# Patient Record
Sex: Female | Born: 1935 | Race: White | Hispanic: No | Smoking: Former smoker
Health system: Southern US, Community
[De-identification: ages and names within clinical notes are randomized; demographics above are authoritative.]

## PROBLEM LIST (undated history)

## (undated) DIAGNOSIS — C50919 Malignant neoplasm of unspecified site of unspecified female breast: Secondary | ICD-10-CM

## (undated) DIAGNOSIS — I509 Heart failure, unspecified: Secondary | ICD-10-CM

## (undated) DIAGNOSIS — I890 Lymphedema, not elsewhere classified: Secondary | ICD-10-CM

## (undated) DIAGNOSIS — I7100 Dissection of unspecified site of aorta: Secondary | ICD-10-CM

## (undated) DIAGNOSIS — N179 Acute kidney failure, unspecified: Secondary | ICD-10-CM

## (undated) DIAGNOSIS — I5031 Acute diastolic (congestive) heart failure: Secondary | ICD-10-CM

## (undated) DIAGNOSIS — I10 Essential (primary) hypertension: Secondary | ICD-10-CM

## (undated) DIAGNOSIS — I4891 Unspecified atrial fibrillation: Secondary | ICD-10-CM

## (undated) DIAGNOSIS — E079 Disorder of thyroid, unspecified: Secondary | ICD-10-CM

## (undated) DIAGNOSIS — N390 Urinary tract infection, site not specified: Secondary | ICD-10-CM

## (undated) DIAGNOSIS — I719 Aortic aneurysm of unspecified site, without rupture: Secondary | ICD-10-CM

## (undated) HISTORY — PX: ASCENDING AORTIC ANEURYSM REPAIR: SHX1191

## (undated) HISTORY — PX: BREAST LUMPECTOMY: SHX2

## (undated) HISTORY — PX: DESCENDING AORTIC ANEURYSM REPAIR: SHX1455

## (undated) HISTORY — PX: ABDOMINAL HYSTERECTOMY: SHX81

---

## 2003-12-13 ENCOUNTER — Encounter: Admission: RE | Admit: 2003-12-13 | Discharge: 2003-12-13 | Payer: Self-pay | Admitting: Sports Medicine

## 2006-12-20 ENCOUNTER — Emergency Department (HOSPITAL_COMMUNITY): Admission: EM | Admit: 2006-12-20 | Discharge: 2006-12-21 | Payer: Self-pay | Admitting: Emergency Medicine

## 2009-09-22 ENCOUNTER — Emergency Department (HOSPITAL_COMMUNITY): Admission: EM | Admit: 2009-09-22 | Discharge: 2009-09-22 | Payer: Self-pay | Admitting: Emergency Medicine

## 2013-04-29 ENCOUNTER — Encounter (HOSPITAL_BASED_OUTPATIENT_CLINIC_OR_DEPARTMENT_OTHER): Payer: Self-pay | Admitting: Emergency Medicine

## 2013-04-29 ENCOUNTER — Emergency Department (HOSPITAL_BASED_OUTPATIENT_CLINIC_OR_DEPARTMENT_OTHER)
Admission: EM | Admit: 2013-04-29 | Discharge: 2013-04-30 | Disposition: A | Discharge status: Other Acute Inpatient Hospital | Payer: Medicare Other | Attending: Emergency Medicine | Admitting: Emergency Medicine

## 2013-04-29 DIAGNOSIS — Z87891 Personal history of nicotine dependence: Secondary | ICD-10-CM | POA: Insufficient documentation

## 2013-04-29 DIAGNOSIS — I509 Heart failure, unspecified: Secondary | ICD-10-CM | POA: Insufficient documentation

## 2013-04-29 DIAGNOSIS — Z9889 Other specified postprocedural states: Secondary | ICD-10-CM | POA: Insufficient documentation

## 2013-04-29 DIAGNOSIS — N179 Acute kidney failure, unspecified: Secondary | ICD-10-CM | POA: Insufficient documentation

## 2013-04-29 DIAGNOSIS — I4891 Unspecified atrial fibrillation: Secondary | ICD-10-CM | POA: Insufficient documentation

## 2013-04-29 DIAGNOSIS — Z90710 Acquired absence of both cervix and uterus: Secondary | ICD-10-CM | POA: Insufficient documentation

## 2013-04-29 DIAGNOSIS — Z853 Personal history of malignant neoplasm of breast: Secondary | ICD-10-CM | POA: Insufficient documentation

## 2013-04-29 DIAGNOSIS — K922 Gastrointestinal hemorrhage, unspecified: Secondary | ICD-10-CM | POA: Insufficient documentation

## 2013-04-29 DIAGNOSIS — Z79899 Other long term (current) drug therapy: Secondary | ICD-10-CM | POA: Insufficient documentation

## 2013-04-29 DIAGNOSIS — I10 Essential (primary) hypertension: Secondary | ICD-10-CM | POA: Insufficient documentation

## 2013-04-29 DIAGNOSIS — E079 Disorder of thyroid, unspecified: Secondary | ICD-10-CM | POA: Insufficient documentation

## 2013-04-29 HISTORY — DX: Essential (primary) hypertension: I10

## 2013-04-29 HISTORY — DX: Unspecified atrial fibrillation: I48.91

## 2013-04-29 HISTORY — DX: Dissection of unspecified site of aorta: I71.00

## 2013-04-29 HISTORY — DX: Aortic aneurysm of unspecified site, without rupture: I71.9

## 2013-04-29 HISTORY — DX: Malignant neoplasm of unspecified site of unspecified female breast: C50.919

## 2013-04-29 HISTORY — DX: Disorder of thyroid, unspecified: E07.9

## 2013-04-29 HISTORY — DX: Heart failure, unspecified: I50.9

## 2013-04-29 HISTORY — DX: Acute kidney failure, unspecified: N17.9

## 2013-04-29 MED ORDER — SODIUM CHLORIDE 0.9 % IV SOLN
INTRAVENOUS | Status: DC
  Administered 2013-04-30: 1000 mL via INTRAVENOUS

## 2013-04-29 MED ORDER — SODIUM CHLORIDE 0.9 % IV SOLN: INTRAVENOUS | Status: DC

## 2013-04-29 NOTE — ED Notes (Signed)
Pt recently discharged from hospital with ARF and CHF. Pt is on xarelto.

## 2013-04-29 NOTE — ED Notes (Signed)
Pt reports rectal bleeding since this am with increase in frequency this evening.

## 2013-04-29 NOTE — ED Provider Notes (Addendum)
CSN: [id number]     Arrival date & time 04/29/13  2315 History  This chart was scribed for Hanley Seamen, MD by Ronal Fear, ED Scribe. This patient was seen in room MH11/MH11 and the patient's care was started at 11:41 PM.    Chief Complaint  Patient presents with  . Rectal Bleeding    The history is provided by the patient. No language interpreter was used.    HPI Comments: Beth [name] is a 77 y.o. female who presents to the Emergency Department complaining of gradually worsening rectal bleeding onset this morning. She has passed both blood and blood mixed with either stool or clots; the blood is brownish red. Pt states that there is no pain with passing stools. She denies nausea, vomiting, light headedness, CP, and SOB. She does not appear to be in any acute distress with no other complaints. There are no specific mitigating or exacerbating factors. PCP: Dr. Marlou Starks  Past Medical History  Diagnosis Date  . Atrial fibrillation   . Hypertension   . Thyroid disease   . Aortic aneurysm with dissection   . Breast CA   . Acute renal failure (ARF)   . CHF (congestive heart failure)   . Aortic aneurysm, descending    Past Surgical History  Procedure Laterality Date  . Ascending aortic aneurysm repair    . Descending aortic aneurysm repair    . Breast lumpectomy    . Abdominal hysterectomy     No family history on file. History  Substance Use Topics  . Smoking status: Former Games developer  . Smokeless tobacco: Not on file  . Alcohol Use: No   OB History   Grav Para Term Preterm Abortions TAB SAB Ect Mult Living                 Review of Systems 10 Systems reviewed and are negative for acute change except as noted in the HPI.  Allergies  Review of patient's allergies indicates no known allergies.  Home Medications   Current Outpatient Rx  Name  Route  Sig  Dispense  Refill  . flecainide (TAMBOCOR) 50 MG tablet   Oral   Take 50 mg by mouth 2 (two) times daily.         . furosemide (LASIX) 20 MG tablet   Oral   Take 20 mg by mouth.         . levothyroxine (SYNTHROID, LEVOTHROID) 75 MCG tablet   Oral   Take 75 mcg by mouth daily before breakfast.         . losartan (COZAAR) 100 MG tablet   Oral   Take 100 mg by mouth daily.         . metoprolol tartrate (LOPRESSOR) 25 MG tablet   Oral   Take 25 mg by mouth 2 (two) times daily.         . pantoprazole (PROTONIX) 40 MG tablet   Oral   Take 40 mg by mouth daily.         . Rivaroxaban (XARELTO PO)   Oral   Take 20 mg by mouth daily.          BP 159/51  Pulse 62  Temp(Src) 97.3 F (36.3 C) (Oral)  Resp 18  Ht 5\' 5"  (1.651 m)  Wt 151 lb (68.493 kg)  BMI 25.13 kg/m2  SpO2 100%  Physical Exam  Nursing note and vitals reviewed.  General: Well-developed, well-nourished female in no acute distress; appearance  consistent with age of record HENT: normocephalic; atraumatic Eyes: pupils equal, round and reactive to light; extraocular muscles intact; no conjunctival pallor  Neck: supple Heart: regular rate and rhythm; no murmurs, rubs or gallops Lungs: clear to auscultation bilaterally Abdomen: soft; nondistended; nontender; no masses or hepatosplenomegaly; bowel sounds present Rectal: normal tone; gross blood in rectal vault Extremities: No deformity; full range of motion; pulses normal; no edema Neurologic: Awake, alert and oriented; motor function intact in all extremities and symmetric; no facial droop; hard of hearing Skin: Warm and dry Psychiatric: Normal mood and affect   ED Course  Procedures (including critical care time) DIAGNOSTIC STUDIES: Oxygen Saturation is 100% on RA, normal by my interpretation.    COORDINATION OF CARE:  11:49 PM- Pt advised of plan for treatment starting th ept of IV fluids and pt agrees.   MDM   Nursing notes and vitals signs, including pulse oximetry, reviewed.  Summary of this visit's results, reviewed by myself:  Labs:  Results  for orders placed during the hospital encounter of 04/29/13 (from the past 24 hour(s))  CBC WITH DIFFERENTIAL     Status: Abnormal   Collection Time    04/29/13 11:57 PM      Result Value Range   WBC 4.7  4.0 - 10.5 K/uL   RBC 3.08 (*) 3.87 - 5.11 MIL/uL   Hemoglobin 9.8 (*) 12.0 - 15.0 g/dL   HCT 47.8 (*) 29.5 - 62.1 %   MCV 98.1  78.0 - 100.0 fL   MCH 31.8  26.0 - 34.0 pg   MCHC 32.5  30.0 - 36.0 g/dL   RDW 30.8  65.7 - 84.6 %   Platelets 203  150 - 400 K/uL   Neutrophils Relative % 65  43 - 77 %   Neutro Abs 3.1  1.7 - 7.7 K/uL   Lymphocytes Relative 16  12 - 46 %   Lymphs Abs 0.8  0.7 - 4.0 K/uL   Monocytes Relative 14 (*) 3 - 12 %   Monocytes Absolute 0.7  0.1 - 1.0 K/uL   Eosinophils Relative 4  0 - 5 %   Eosinophils Absolute 0.2  0.0 - 0.7 K/uL   Basophils Relative 0  0 - 1 %   Basophils Absolute 0.0  0.0 - 0.1 K/uL  COMPREHENSIVE METABOLIC PANEL     Status: Abnormal   Collection Time    04/29/13 11:57 PM      Result Value Range   Sodium 133 (*) 135 - 145 mEq/L   Potassium 4.8  3.5 - 5.1 mEq/L   Chloride 97  96 - 112 mEq/L   CO2 26  19 - 32 mEq/L   Glucose, Bld 97  70 - 99 mg/dL   BUN 37 (*) 6 - 23 mg/dL   Creatinine, Ser 9.62 (*) 0.50 - 1.10 mg/dL   Calcium 9.6  8.4 - 95.2 mg/dL   Total Protein 6.9  6.0 - 8.3 g/dL   Albumin 3.4 (*) 3.5 - 5.2 g/dL   AST 21  0 - 37 U/L   ALT 43 (*) 0 - 35 U/L   Alkaline Phosphatase 76  39 - 117 U/L   Total Bilirubin 0.3  0.3 - 1.2 mg/dL   GFR calc non Af Amer 30 (*) >90 mL/min   GFR calc Af Amer 35 (*) >90 mL/min  PROTIME-INR     Status: Abnormal   Collection Time    04/29/13 11:57 PM      Result  Value Range   Prothrombin Time 17.4 (*) 11.6 - 15.2 seconds   INR 1.47  0.00 - 1.49  APTT     Status: Abnormal   Collection Time    04/29/13 11:57 PM      Result Value Range   aPTT 47 (*) 24 - 37 seconds  OCCULT BLOOD X 1 CARD TO LAB, STOOL     Status: Abnormal   Collection Time    04/29/13 11:58 PM      Result Value Range    Fecal Occult Bld POSITIVE (*) NEGATIVE    I personally performed the services described in this documentation, which was scribed in my presence.  The recorded information has been reviewed and is accurate.  Dr. Izora Ribas accepts for transfer to [facility]  Hanley Seamen, MD 04/30/13 1610  Hanley Seamen, MD 04/30/13 9604

## 2013-04-30 LAB — CBC WITH DIFFERENTIAL/PLATELET
Basophils Absolute: 0 10*3/uL (ref 0.0–0.1)
Basophils Relative: 0 % (ref 0–1)
HCT: 30.2 % — ABNORMAL LOW (ref 36.0–46.0)
Hemoglobin: 9.8 g/dL — ABNORMAL LOW (ref 12.0–15.0)
Lymphocytes Relative: 16 % (ref 12–46)
MCHC: 32.5 g/dL (ref 30.0–36.0)
Monocytes Absolute: 0.7 10*3/uL (ref 0.1–1.0)
Neutro Abs: 3.1 10*3/uL (ref 1.7–7.7)
Neutrophils Relative %: 65 % (ref 43–77)
RDW: 13.9 % (ref 11.5–15.5)
WBC: 4.7 10*3/uL (ref 4.0–10.5)

## 2013-04-30 LAB — COMPREHENSIVE METABOLIC PANEL
ALT: 43 U/L — ABNORMAL HIGH (ref 0–35)
AST: 21 U/L (ref 0–37)
Albumin: 3.4 g/dL — ABNORMAL LOW (ref 3.5–5.2)
Alkaline Phosphatase: 76 U/L (ref 39–117)
CO2: 26 mEq/L (ref 19–32)
Chloride: 97 mEq/L (ref 96–112)
GFR calc non Af Amer: 30 mL/min — ABNORMAL LOW (ref >90)
Potassium: 4.8 mEq/L (ref 3.5–5.1)
Total Bilirubin: 0.3 mg/dL (ref 0.3–1.2)

## 2013-04-30 NOTE — ED Notes (Signed)
MD at bedside to discuss results of testing and pending transfer to [facility].

## 2013-04-30 NOTE — ED Notes (Signed)
Placed on cardiac monitor with pulse ox.

## 2013-09-17 ENCOUNTER — Emergency Department (HOSPITAL_BASED_OUTPATIENT_CLINIC_OR_DEPARTMENT_OTHER)
Admission: EM | Admit: 2013-09-17 | Discharge: 2013-09-17 | Disposition: A | Discharge status: Home / Self Care | Payer: Medicare Other | Attending: Emergency Medicine | Admitting: Emergency Medicine

## 2013-09-17 ENCOUNTER — Encounter (HOSPITAL_BASED_OUTPATIENT_CLINIC_OR_DEPARTMENT_OTHER): Payer: Self-pay | Admitting: Emergency Medicine

## 2013-09-17 DIAGNOSIS — I4891 Unspecified atrial fibrillation: Secondary | ICD-10-CM | POA: Insufficient documentation

## 2013-09-17 DIAGNOSIS — N179 Acute kidney failure, unspecified: Secondary | ICD-10-CM | POA: Insufficient documentation

## 2013-09-17 DIAGNOSIS — J329 Chronic sinusitis, unspecified: Secondary | ICD-10-CM

## 2013-09-17 DIAGNOSIS — Z7982 Long term (current) use of aspirin: Secondary | ICD-10-CM | POA: Insufficient documentation

## 2013-09-17 DIAGNOSIS — Z853 Personal history of malignant neoplasm of breast: Secondary | ICD-10-CM | POA: Insufficient documentation

## 2013-09-17 DIAGNOSIS — Z87891 Personal history of nicotine dependence: Secondary | ICD-10-CM | POA: Insufficient documentation

## 2013-09-17 DIAGNOSIS — Z7901 Long term (current) use of anticoagulants: Secondary | ICD-10-CM | POA: Insufficient documentation

## 2013-09-17 DIAGNOSIS — E079 Disorder of thyroid, unspecified: Secondary | ICD-10-CM | POA: Insufficient documentation

## 2013-09-17 DIAGNOSIS — I10 Essential (primary) hypertension: Secondary | ICD-10-CM | POA: Insufficient documentation

## 2013-09-17 DIAGNOSIS — I509 Heart failure, unspecified: Secondary | ICD-10-CM | POA: Insufficient documentation

## 2013-09-17 DIAGNOSIS — Z79899 Other long term (current) drug therapy: Secondary | ICD-10-CM | POA: Insufficient documentation

## 2013-09-17 MED ORDER — AMOXICILLIN-POT CLAVULANATE 875-125 MG PO TABS: 1.0000 | ORAL_TABLET | Freq: Two times a day (BID) | ORAL | Status: DC

## 2013-09-17 NOTE — ED Provider Notes (Signed)
CSN: [id number]     Arrival date & time 09/17/13  1031 History   First MD Initiated Contact with Patient 09/17/13 1104     Chief Complaint  Patient presents with  . Nasal Congestion     (Consider location/radiation/quality/duration/timing/severity/associated sxs/prior Treatment) The history is provided by the patient.   Patient complaining of 4 days of sinus congestion and drainage similar to her prior seasonal sinusitis. States and she feels this way she requires antibiotic prescription. Denies any fever or chills. No neck pain or photophobia. No severe headaches. No vomiting noted. No rashes appreciated. Symptoms are persistent. No treatment used prior to arrival. Past Medical History  Diagnosis Date  . Atrial fibrillation   . Hypertension   . Thyroid disease   . Aortic aneurysm with dissection   . Breast CA   . Acute renal failure (ARF)   . CHF (congestive heart failure)   . Aortic aneurysm, descending    Past Surgical History  Procedure Laterality Date  . Ascending aortic aneurysm repair    . Descending aortic aneurysm repair    . Breast lumpectomy    . Abdominal hysterectomy     No family history on file. History  Substance Use Topics  . Smoking status: Former Research scientist (life sciences)  . Smokeless tobacco: Not on file  . Alcohol Use: No   OB History   Grav Para Term Preterm Abortions TAB SAB Ect Mult Living                 Review of Systems  All other systems reviewed and are negative.      Allergies  Review of patient's allergies indicates no known allergies.  Home Medications   Current Outpatient Rx  Name  Route  Sig  Dispense  Refill  . aspirin 81 MG tablet   Oral   Take 81 mg by mouth daily.         . flecainide (TAMBOCOR) 50 MG tablet   Oral   Take 50 mg by mouth 2 (two) times daily.         . furosemide (LASIX) 20 MG tablet   Oral   Take 20 mg by mouth.         . levothyroxine (SYNTHROID, LEVOTHROID) 75 MCG tablet   Oral   Take 75 mcg by mouth  daily before breakfast.         . losartan (COZAAR) 100 MG tablet   Oral   Take 100 mg by mouth daily.         . metoprolol tartrate (LOPRESSOR) 25 MG tablet   Oral   Take 25 mg by mouth 2 (two) times daily.         . pantoprazole (PROTONIX) 40 MG tablet   Oral   Take 40 mg by mouth daily.         Marland Kitchen amoxicillin-clavulanate (AUGMENTIN) 875-125 MG per tablet   Oral   Take 1 tablet by mouth every 12 (twelve) hours.   14 tablet   0   . Rivaroxaban (XARELTO PO)   Oral   Take 20 mg by mouth daily.          BP 154/73  Pulse 56  Temp(Src) 98.3 F (36.8 C)  Resp 16  Ht 5\' 5"  (1.651 m)  Wt 149 lb (67.586 kg)  BMI 24.79 kg/m2  SpO2 97% Physical Exam  Nursing note and vitals reviewed. Constitutional: She is oriented to person, place, and time. She appears well-developed and well-nourished.  Non-toxic  appearance. No distress.  HENT:  Head: Normocephalic and atraumatic.  Eyes: Conjunctivae, EOM and lids are normal. Pupils are equal, round, and reactive to light.  Neck: Normal range of motion. Neck supple. No tracheal deviation present. No mass present.  Cardiovascular: Normal rate, regular rhythm and normal heart sounds.  Exam reveals no gallop.   No murmur heard. Pulmonary/Chest: Effort normal and breath sounds normal. No stridor. No respiratory distress. She has no decreased breath sounds. She has no wheezes. She has no rhonchi. She has no rales.  Abdominal: Soft. Normal appearance and bowel sounds are normal. She exhibits no distension. There is no tenderness. There is no rebound and no CVA tenderness.  Musculoskeletal: Normal range of motion. She exhibits no edema and no tenderness.  Neurological: She is alert and oriented to person, place, and time. She has normal strength. No cranial nerve deficit or sensory deficit. GCS eye subscore is 4. GCS verbal subscore is 5. GCS motor subscore is 6.  Skin: Skin is warm and dry. No abrasion and no rash noted.  Psychiatric: She  has a normal mood and affect. Her speech is normal and behavior is normal.    ED Course  Procedures (including critical care time) Labs Review Labs Reviewed - No data to display Imaging Review No results found.   EKG Interpretation None      MDM   Final diagnoses:  Sinusitis    Patient given prescription for Augmentin and will followup with her Dr.    Leota Jacobsen, MD 09/17/13 [phone]

## 2013-09-17 NOTE — ED Notes (Signed)
Patient states she has a four day history of sinus congestion with drainage into the back of the throat causing chest congestion.  States she started with a dry hacking cough.  C/O generalized fatigue.

## 2013-09-17 NOTE — Discharge Instructions (Signed)

## 2013-10-11 ENCOUNTER — Encounter (HOSPITAL_BASED_OUTPATIENT_CLINIC_OR_DEPARTMENT_OTHER): Payer: Self-pay | Admitting: Emergency Medicine

## 2013-10-11 ENCOUNTER — Emergency Department (HOSPITAL_BASED_OUTPATIENT_CLINIC_OR_DEPARTMENT_OTHER)
Admission: EM | Admit: 2013-10-11 | Discharge: 2013-10-11 | Disposition: A | Discharge status: Home / Self Care | Payer: Medicare Other | Attending: Emergency Medicine | Admitting: Emergency Medicine

## 2013-10-11 DIAGNOSIS — R6889 Other general symptoms and signs: Secondary | ICD-10-CM

## 2013-10-11 DIAGNOSIS — Z87891 Personal history of nicotine dependence: Secondary | ICD-10-CM | POA: Insufficient documentation

## 2013-10-11 DIAGNOSIS — Z87448 Personal history of other diseases of urinary system: Secondary | ICD-10-CM | POA: Insufficient documentation

## 2013-10-11 DIAGNOSIS — Z7982 Long term (current) use of aspirin: Secondary | ICD-10-CM | POA: Insufficient documentation

## 2013-10-11 DIAGNOSIS — I509 Heart failure, unspecified: Secondary | ICD-10-CM | POA: Insufficient documentation

## 2013-10-11 DIAGNOSIS — R0981 Nasal congestion: Secondary | ICD-10-CM

## 2013-10-11 DIAGNOSIS — I4891 Unspecified atrial fibrillation: Secondary | ICD-10-CM | POA: Insufficient documentation

## 2013-10-11 DIAGNOSIS — E079 Disorder of thyroid, unspecified: Secondary | ICD-10-CM | POA: Insufficient documentation

## 2013-10-11 DIAGNOSIS — Z792 Long term (current) use of antibiotics: Secondary | ICD-10-CM | POA: Insufficient documentation

## 2013-10-11 DIAGNOSIS — J029 Acute pharyngitis, unspecified: Secondary | ICD-10-CM | POA: Insufficient documentation

## 2013-10-11 DIAGNOSIS — H748X9 Other specified disorders of middle ear and mastoid, unspecified ear: Secondary | ICD-10-CM | POA: Insufficient documentation

## 2013-10-11 DIAGNOSIS — Z7901 Long term (current) use of anticoagulants: Secondary | ICD-10-CM | POA: Insufficient documentation

## 2013-10-11 DIAGNOSIS — Z853 Personal history of malignant neoplasm of breast: Secondary | ICD-10-CM | POA: Insufficient documentation

## 2013-10-11 DIAGNOSIS — I10 Essential (primary) hypertension: Secondary | ICD-10-CM | POA: Insufficient documentation

## 2013-10-11 DIAGNOSIS — Z79899 Other long term (current) drug therapy: Secondary | ICD-10-CM | POA: Insufficient documentation

## 2013-10-11 MED ORDER — FLUTICASONE PROPIONATE 50 MCG/ACT NA SUSP: 1.0000 | Freq: Every day | NASAL | Status: DC

## 2013-10-11 MED ORDER — HYDROCODONE-ACETAMINOPHEN 7.5-325 MG/15ML PO SOLN: 10.0000 mL | Freq: Four times a day (QID) | ORAL | Status: DC | PRN

## 2013-10-11 MED ORDER — LORATADINE 10 MG PO TABS: 10.0000 mg | ORAL_TABLET | Freq: Every day | ORAL | Status: DC

## 2013-10-11 NOTE — Discharge Instructions (Signed)
Congestion  You were seen today for continued nasal congestion and sore throat. There is no sign of acute sinusitis at this time.  Would continue supportive treatment and add nasal saline, daily antihistamine.  You will be given any pain medication for her sore throat. He can followup with her primary physician and get referral to ENT.  You should return if you have any new or worsening symptoms  TREATMENT  Treatment is directed at relieving symptoms. There is no cure. Antibiotics are not effective, because the infection is caused by a virus or allergies, not by bacteria. Treatment may include:  Increased fluid intake. Sports drinks offer valuable electrolytes, sugars, and fluids.  Breathing heated mist or steam (vaporizer or shower).  Eating chicken soup or other clear broths, and maintaining good nutrition.  Getting plenty of rest.  Using gargles or lozenges for comfort.  Controlling fevers with ibuprofen or acetaminophen as directed by your caregiver.  Increasing usage of your inhaler if you have asthma. Zinc gel and zinc lozenges, taken in the first 24 hours of the common cold, can shorten the duration and lessen the severity of symptoms. Pain medicines may help with fever, muscle aches, and throat pain. A variety of non-prescription medicines are available to treat congestion and runny nose. Your caregiver can make recommendations and may suggest nasal or lung inhalers for other symptoms.  HOME CARE INSTRUCTIONS   Only take over-the-counter or prescription medicines for pain, discomfort, or fever as directed by your caregiver.  Use a warm mist humidifier or inhale steam from a shower to increase air moisture. This may keep secretions moist and make it easier to breathe.  Drink enough water and fluids to keep your urine clear or pale yellow.  Rest as needed.  Return to work when your temperature has returned to normal or as your caregiver advises. You may need to stay home longer  to avoid infecting others. You can also use a face mask and careful hand washing to prevent spread of the virus. SEEK MEDICAL CARE IF:   After the first few days, you feel you are getting worse rather than better.  You need your caregiver's advice about medicines to control symptoms.  You develop chills, worsening shortness of breath, or brown or red sputum. These may be signs of pneumonia.  You develop yellow or brown nasal discharge or pain in the face, especially when you bend forward. These may be signs of sinusitis.  You develop a fever, swollen neck glands, pain with swallowing, or white areas in the back of your throat. These may be signs of strep throat. SEEK IMMEDIATE MEDICAL CARE IF:   You have a fever.  You develop severe or persistent headache, ear pain, sinus pain, or chest pain.  You develop wheezing, a prolonged cough, cough up blood, or have a change in your usual mucus (if you have chronic lung disease).  You develop sore muscles or a stiff neck. Document Released: 12/04/2000 Document Revised: 09/02/2011 Document Reviewed: 10/12/2010 [facility] Patient Information 2014 [place], [place].

## 2013-10-11 NOTE — ED Provider Notes (Signed)
CSN: [id number]     Arrival date & time 10/11/13  1536 History   This chart was scribed for Merryl Hacker, MD by Celesta Gentile, ED Scribe. The patient was seen in room MH07/MH07. Patient's care was started at 3:57 PM.  Chief Complaint  Patient presents with  . Sore Throat   The history is provided by the patient. No language interpreter was used.   HPI Comments: Beth [name] is a 78 y.o. female who presents to the Emergency Department complaining of URI symptoms that started about 2 weeks ago.  Pt states she was seen here on 09/17/13 for same complaint and prescribed Augmentin.  She states after three days of the antibiotics her symptoms improved.  She states the next day her symptoms returned, but she completed the antibiotics.  Pt has associated chest congestion, rhinorrhea, sore throat, nasal congestion, and ear pressure bilaterally.  Pt denies fevers, chills, cough and otalgia.  Sore throat is worse in the morning.  She reports she has a h/o sinus infections.  Patient states that she normally gets symptoms at the change of seasons but denies any allergies. States that she has wheezed in the past and prescribed an inhaler.  She denies wheezing this visit.  Denies chest pain or SOB.  Pt denies difficulty breathing or swallowing.  She reports she has tried warm salt water, Tylenol, and Flonase without much relief.  Pt states she takes medicine for HTN regularly.  Pt reports she does have a PCP, but she couldn't schedule an appointment with them.    Past Medical History  Diagnosis Date  . Atrial fibrillation   . Hypertension   . Thyroid disease   . Aortic aneurysm with dissection   . Breast CA   . Acute renal failure (ARF)   . CHF (congestive heart failure)   . Aortic aneurysm, descending    Past Surgical History  Procedure Laterality Date  . Ascending aortic aneurysm repair    . Descending aortic aneurysm repair    . Breast lumpectomy    . Abdominal hysterectomy     History  reviewed. No pertinent family history. History  Substance Use Topics  . Smoking status: Former Research scientist (life sciences)  . Smokeless tobacco: Not on file  . Alcohol Use: No   OB History   Grav Para Term Preterm Abortions TAB SAB Ect Mult Living                 Review of Systems  Constitutional: Negative for fever.  HENT: Positive for congestion, ear pain, postnasal drip, rhinorrhea and sore throat.   Respiratory: Negative for cough, chest tightness, shortness of breath, wheezing and stridor.   Cardiovascular: Negative for chest pain.  Gastrointestinal: Negative for nausea, vomiting and abdominal pain.  Genitourinary: Negative for dysuria.  Musculoskeletal: Negative for back pain.  Skin: Negative for rash.  Neurological: Negative for dizziness and headaches.  Psychiatric/Behavioral: Negative for confusion.  All other systems reviewed and are negative.     Allergies  Review of patient's allergies indicates no known allergies.  Home Medications   Prior to Admission medications   Medication Sig Start Date End Date Taking? Authorizing Provider  amoxicillin-clavulanate (AUGMENTIN) 875-125 MG per tablet Take 1 tablet by mouth every 12 (twelve) hours. 09/17/13   Leota Jacobsen, MD  aspirin 81 MG tablet Take 81 mg by mouth daily.    Historical Provider, MD  flecainide (TAMBOCOR) 50 MG tablet Take 50 mg by mouth 2 (two) times daily.  Historical Provider, MD  furosemide (LASIX) 20 MG tablet Take 20 mg by mouth.    Historical Provider, MD  levothyroxine (SYNTHROID, LEVOTHROID) 75 MCG tablet Take 75 mcg by mouth daily before breakfast.    Historical Provider, MD  losartan (COZAAR) 100 MG tablet Take 100 mg by mouth daily.    Historical Provider, MD  metoprolol tartrate (LOPRESSOR) 25 MG tablet Take 25 mg by mouth 2 (two) times daily.    Historical Provider, MD  pantoprazole (PROTONIX) 40 MG tablet Take 40 mg by mouth daily.    Historical Provider, MD  Rivaroxaban (XARELTO PO) Take 20 mg by mouth daily.     Historical Provider, MD   Triage Vitals: BP 148/77  Pulse 77  Temp(Src) 98.3 F (36.8 C) (Oral)  Resp 16  Ht 5\' 5"  (1.651 m)  Wt 147 lb (66.679 kg)  BMI 24.46 kg/m2  SpO2 97%  Physical Exam  Nursing note and vitals reviewed. Constitutional: She is oriented to person, place, and time. She appears well-developed and well-nourished. No distress.  Appears younger than stated age  HENT:  Head: Normocephalic and atraumatic.  Mouth/Throat: Oropharynx is clear and moist. No oropharyngeal exudate.  Trace effusions behind bilateral TMs without evidence of purulence, intact light reflexes, no bulging of the TM, postnasal drip noted, no erythema of the posterior oropharynx, uvula midline, no trismus, no tenderness noted over the frontal or maxillary sinuses  Eyes: Pupils are equal, round, and reactive to light.  Neck: Neck supple.  Cardiovascular: Normal rate, regular rhythm and normal heart sounds.   No murmur heard. Pulmonary/Chest: Effort normal and breath sounds normal. No respiratory distress. She has no wheezes.  Abdominal: Soft. There is no tenderness.  Lymphadenopathy:    She has no cervical adenopathy.  Neurological: She is alert and oriented to person, place, and time.  Skin: Skin is warm and dry. No rash noted.  Psychiatric: She has a normal mood and affect.    ED Course  Procedures (including critical care time) DIAGNOSTIC STUDIES: Oxygen Saturation is 97% on RA, normal by my interpretation.    COORDINATION OF CARE: 4:16 PM-Will discharge with Flonase and Claritin.  Patient informed of current plan of treatment and evaluation and agrees with plan.     MDM   Final diagnoses:  Congestion of throat  Congestion of nasal sinus   Patient presents with persistent URI symptoms. Vital signs are reassuring. She is nontoxic on exam.  She is afebrile and denies any history of fever. Patient states that she came here for antibiotics. Physical exam is most suggestive of allergic  etiology for current symptoms including postnasal drip, small bilateral effusions, and normal oral pharyngeal exam. Patient has no evidence of acute sinusitis and no tenderness to the sinuses. Had a long discussion with the patient regarding the use of Flonase, nasal saline, and daily antihistamine such as Claritin or Zyrtec. The patient now tells me that she takes "one of those daily and it doesn't work." Neither one is listed on her medication list.  I advised the patient that at this time there is not an indication for antibiotics and I will treat her conservatively. I have also advised her to followup with her primary physician for ENT referral if she has persistent symptoms. The patient stated that "I know this will get worse and I will need antibiotics." I have encouraged the patient to return if she has any new or worsening symptoms but again reinforced that at this time I do not feel  there is an indication for antibiotics at this time.  I did offer to treat the patient's morning sore throat with Lortab elixir. She was instructed not to use additional Tylenol.   After history, exam, and medical workup I feel the patient has been appropriately medically screened and is safe for discharge home. Pertinent diagnoses were discussed with the patient. Patient was given return precautions.  I personally performed the services described in this documentation, which was scribed in my presence. The recorded information has been reviewed and is accurate.      Merryl Hacker, MD 10/11/13 [phone]

## 2013-10-11 NOTE — ED Notes (Signed)
Pt c/o sore throat and uri symptoms x 2 weeks, seen here on 3/27 for same completed ABX

## 2013-10-13 ENCOUNTER — Telehealth: Payer: Self-pay | Admitting: Cardiology

## 2013-10-13 NOTE — Telephone Encounter (Signed)
ROI faxed to [facility] [phone]

## 2013-10-19 ENCOUNTER — Telehealth: Payer: Self-pay | Admitting: Cardiology

## 2013-10-19 NOTE — Telephone Encounter (Signed)
Records rec From [facility], gave to Nettie

## 2013-10-20 ENCOUNTER — Ambulatory Visit
Admission: RE | Admit: 2013-10-20 | Discharge: 2013-10-20 | Disposition: A | Discharge status: Home / Self Care | Payer: Medicare Other | Source: Ambulatory Visit | Attending: Cardiology | Admitting: Cardiology

## 2013-10-20 ENCOUNTER — Encounter: Payer: Self-pay | Admitting: Cardiology

## 2013-10-20 ENCOUNTER — Ambulatory Visit (INDEPENDENT_AMBULATORY_CARE_PROVIDER_SITE_OTHER): Payer: Medicare Other | Admitting: Cardiology

## 2013-10-20 VITALS — BP 154/76 | HR 71 | Ht 65.0 in | Wt 151.0 lb

## 2013-10-20 DIAGNOSIS — R5383 Other fatigue: Secondary | ICD-10-CM

## 2013-10-20 DIAGNOSIS — R0989 Other specified symptoms and signs involving the circulatory and respiratory systems: Secondary | ICD-10-CM

## 2013-10-20 DIAGNOSIS — I119 Hypertensive heart disease without heart failure: Secondary | ICD-10-CM

## 2013-10-20 DIAGNOSIS — I7121 Aneurysm of the ascending aorta, without rupture: Secondary | ICD-10-CM | POA: Insufficient documentation

## 2013-10-20 DIAGNOSIS — R0602 Shortness of breath: Secondary | ICD-10-CM | POA: Insufficient documentation

## 2013-10-20 DIAGNOSIS — I4891 Unspecified atrial fibrillation: Secondary | ICD-10-CM

## 2013-10-20 DIAGNOSIS — E039 Hypothyroidism, unspecified: Secondary | ICD-10-CM | POA: Insufficient documentation

## 2013-10-20 DIAGNOSIS — R0609 Other forms of dyspnea: Secondary | ICD-10-CM

## 2013-10-20 DIAGNOSIS — I7123 Aneurysm of the descending thoracic aorta, without rupture: Secondary | ICD-10-CM | POA: Insufficient documentation

## 2013-10-20 DIAGNOSIS — I712 Thoracic aortic aneurysm, without rupture, unspecified: Secondary | ICD-10-CM

## 2013-10-20 DIAGNOSIS — R5381 Other malaise: Secondary | ICD-10-CM | POA: Insufficient documentation

## 2013-10-20 LAB — CBC WITH DIFFERENTIAL/PLATELET
BASOS PCT: 0.1 % (ref 0.0–3.0)
Basophils Absolute: 0 10*3/uL (ref 0.0–0.1)
EOS ABS: 0 10*3/uL (ref 0.0–0.7)
EOS PCT: 0.4 % (ref 0.0–5.0)
HCT: 35.2 % — ABNORMAL LOW (ref 36.0–46.0)
HEMOGLOBIN: 11.6 g/dL — AB (ref 12.0–15.0)
Lymphs Abs: 0.5 10*3/uL — ABNORMAL LOW (ref 0.7–4.0)
MCHC: 33 g/dL (ref 30.0–36.0)
MCV: 94.5 fl (ref 78.0–100.0)
Monocytes Absolute: 0.6 10*3/uL (ref 0.1–1.0)
Monocytes Relative: 7.4 % (ref 3.0–12.0)
NEUTROS ABS: 7.1 10*3/uL (ref 1.4–7.7)
Neutrophils Relative %: 85.8 % — ABNORMAL HIGH (ref 43.0–77.0)
Platelets: 199 10*3/uL (ref 150.0–400.0)
RBC: 3.72 Mil/uL — AB (ref 3.87–5.11)
RDW: 16.6 % — ABNORMAL HIGH (ref 11.5–14.6)
WBC: 8.3 10*3/uL (ref 4.5–10.5)

## 2013-10-20 LAB — BASIC METABOLIC PANEL
BUN: 30 mg/dL — ABNORMAL HIGH (ref 6–23)
CALCIUM: 9.3 mg/dL (ref 8.4–10.5)
CO2: 26 mEq/L (ref 19–32)
CREATININE: 1.1 mg/dL (ref 0.4–1.2)
Chloride: 89 mEq/L — ABNORMAL LOW (ref 96–112)
GFR: 49.25 mL/min — ABNORMAL LOW (ref >60.00)
Glucose, Bld: 105 mg/dL — ABNORMAL HIGH (ref 70–99)
Potassium: 4.7 mEq/L (ref 3.5–5.1)
Sodium: 123 mEq/L — ABNORMAL LOW (ref 135–145)

## 2013-10-20 LAB — LIPID PANEL
CHOL/HDL RATIO: 2
Cholesterol: 225 mg/dL — ABNORMAL HIGH (ref 0–200)
HDL: 117 mg/dL (ref >39.00)
LDL CALC: 97 mg/dL (ref 0–99)
TRIGLYCERIDES: 57 mg/dL (ref 0.0–149.0)
VLDL: 11.4 mg/dL (ref 0.0–40.0)

## 2013-10-20 LAB — HEPATIC FUNCTION PANEL
ALT: 21 U/L (ref 0–35)
AST: 16 U/L (ref 0–37)
Albumin: 3.9 g/dL (ref 3.5–5.2)
Alkaline Phosphatase: 51 U/L (ref 39–117)
BILIRUBIN DIRECT: 0.1 mg/dL (ref 0.0–0.3)
BILIRUBIN TOTAL: 0.8 mg/dL (ref 0.3–1.2)
Total Protein: 6.6 g/dL (ref 6.0–8.3)

## 2013-10-20 LAB — TSH: TSH: 0.21 u[IU]/mL — ABNORMAL LOW (ref 0.35–5.50)

## 2013-10-20 NOTE — Progress Notes (Signed)
Beth [name] Date of Birth:  09-20-1935 [place] [address] [place], [place]  [address] [phone]        Fax   [phone]   History of Present Illness: This pleasant 78 year old woman is seen by me in the office for the first time today.  She is self-referred.  She previously had had her cardiology followup at [facility] but wishes to make a change.  She has a past history of having had surgery for an ascending and transverse thoracic aortic aneurysm treated with endograft in February 2006 at [facility].  In November 2009 she had surgery on a descending thoracic aortic aneurysm also at [facility].  In 2010 she underwent percutaneous repair of her endograft.  She had coronary angiography in 2006 which showed smooth and normal coronary arteries. She had a past history of mild but not severe hypertension.  She was a remote smoker but not for 40 years.  There is no family history of aneurysms. The patient also has a history of atrial fibrillation.  She has had 2 cardioversions, most recently in November 2014.  Her initial cardioversion lasted only 2 months before she reverted to atrial fibrillation.  She was then placed on flecainide and the second cardioversion in November 2014.  She remained in normal sinus rhythm until earlier this month when she was noted to be back in atrial fibrillation.  She previously was on Xarelto.  However after her second cardioversion in November 2014 she went down to the beach feeling well.  While at the beach she states that she went into heart failure and renal failure.  She was told that her heart rate was extremely slow because of the medication.  The dose of her flecainide was cut in half and the dose of her metoprolol was cut in half and she improved.  She returned home and within several days had rectal bleeding and was followed by gastroenterology in [place].  She was told that the bleeding was from an ulcerated  area of the rectum which they thought was secondary to her previous heart failure and renal failure.  Some time in that interval of time her Xarelto was stopped and has not been restarted.  She has not had any subsequent evidence of GI bleeding.  She has been on Protonix.  She is also on [name] for arthritis.  She has not been experiencing any chest pain or angina.  She has had more exertional dyspnea over the past year.  She had to give up playing tennis about a year ago because of the dyspnea and fatigue.    Current Outpatient Prescriptions  Medication Sig Dispense Refill  . aspirin 81 MG tablet Take 81 mg by mouth daily.      . flecainide (TAMBOCOR) 50 MG tablet Take 50 mg by mouth 2 (two) times daily.      . fluticasone (FLONASE) 50 MCG/ACT nasal spray Place 1 spray into both nostrils daily.  16 g  2  . hydrochlorothiazide (HYDRODIURIL) 50 MG tablet Take 50 mg by mouth daily.      Marland Kitchen levothyroxine (SYNTHROID, LEVOTHROID) 75 MCG tablet Take 75 mcg by mouth daily before breakfast.      . loratadine (CLARITIN) 10 MG tablet Take 1 tablet (10 mg total) by mouth daily.  30 tablet  0  . losartan (COZAAR) 100 MG tablet Take 100 mg by mouth daily.      . metoprolol succinate (TOPROL-XL) 25  MG 24 hr tablet Take 25 mg by mouth daily.      . pantoprazole (PROTONIX) 40 MG tablet Take 40 mg by mouth daily.      . piroxicam ([name]) 20 MG capsule Take 20 mg by mouth daily.      . predniSONE (DELTASONE) 10 MG tablet Take 10 mg by mouth as needed.      Marland Kitchen HYDROcodone-acetaminophen (HYCET) 7.5-325 mg/15 ml solution Take 10 mLs by mouth every 6 (six) hours as needed (sore throat).  120 mL  0   No current facility-administered medications for this visit.    No Known Allergies  Patient Active Problem List   Diagnosis Date Noted  . Atrial fibrillation 10/20/2013  . DOE (dyspnea on exertion) 10/20/2013  . Benign hypertensive heart disease without heart failure 10/20/2013  . Thoracic ascending aortic  aneurysm 10/20/2013  . Descending thoracic aortic aneurysm 10/20/2013  . Malaise and fatigue 10/20/2013  . Hypothyroid 10/20/2013    History  Smoking status  . Former Smoker  Smokeless tobacco  . Not on file    History  Alcohol Use No  Patient has an alcoholic drink each night.   Family history is negative for aneurysms   Review of Systems: Constitutional: no fever chills diaphoresis or fatigue or change in weight.  Head and neck: no hearing loss, no epistaxis, no photophobia or visual disturbance. Respiratory: No cough,  or wheezing. Cardiovascular: No chest pain peripheral edema, palpitations. positive for exertional dyspnea and atrial fibrillation  Gastrointestinal: No abdominal distention, no abdominal pain, no change in bowel habits hematochezia or melena. Genitourinary: No dysuria, no frequency, no urgency, no nocturia. Musculoskeletal:No arthralgias, no back pain, no gait disturbance or myalgias. positive for arthritis  Neurological: No dizziness, no headaches, no numbness, no seizures, no syncope, no weakness, no tremors. Hematologic: No lymphadenopathy, no easy bruising. Psychiatric: No confusion, no hallucinations, no sleep disturbance.    Physical Exam: Filed Vitals:   10/20/13 1036  BP: 154/76  Pulse: 71  The general appearance reveals a well-developed well-nourished middle-aged woman in no distress.The head and neck exam reveals pupils equal and reactive.  Extraocular movements are full.  There is no scleral icterus.  The mouth and pharynx are normal.  The neck is supple.  The carotids reveal no bruits.  The jugular venous pressure is normal.  The  thyroid is not enlarged.  There is no lymphadenopathy.  The chest is clear to percussion and auscultation.  There are no rales or rhonchi.  Expansion of the chest is symmetrical.  The precordium is quiet.  The first heart sound is normal.  The second heart sound is physiologically split.  There is no  gallop rub or  click.  There is a grade 2/6 harsh systolic ejection murmur at the base.  There is no diastolic murmur heard. There is no abnormal lift or heave.  The abdomen is soft and nontender.  The bowel sounds are normal.  The liver and spleen are not enlarged.  There are no abdominal masses.  There are no abdominal bruits.  Extremities reveal good pedal pulses.  There is no phlebitis.  There is mild edema of the right ankle.  There is no cyanosis or clubbing.  Strength is normal and symmetrical in all extremities.  There is no lateralizing weakness.  There are no sensory deficits.  The skin is warm and dry.  There is no rash.  EKG shows atrial fibrillation and incomplete right bundle branch block and possible old anteroseptal  myocardial infarction   Assessment / Plan: 1.  Status post endovascular repair of ascending and transverse thoracic aneurysm in 2006 and of descending thoracic aneurysm in 2009 2. recurrent atrial fibrillation, not currently on anticoagulation 3.  essential hypertension 4. past history of GI bleeding in November 2014 secondary to rectal ulcer, resolved 5.  Osteoarthritis currently on [name]. 6. Hypothyroidism 7. malaise and fatigue  Plan: We will update labs today with CBC is a metabolic panel hepatic function panel lipid panel and TSH. We will obtain a 2D echocardiogram We will get a chest x-ray After above labs we will consider resumption of Xarelto in the appropriate dose after which we can consider elective cardioversion again. Continue current dose of flecainide and metoprolol succinate. Recheck in one month or sooner when necessary. She desires to continue to follow with her thoracic surgeon Dr. Lalla Brothers at [facility] for her yearly CT scan and followup visit regarding her thoracic aneurysms

## 2013-10-20 NOTE — Patient Instructions (Addendum)
Will obtain labs today and call you with the results (lp/bmet/hfp/cbc/tsh)  Your physician recommends that you continue on your current medications as directed. Please refer to the Current Medication list given to you today.  Your physician has requested that you have an echocardiogram. Echocardiography is a painless test that uses sound waves to create images of your heart. It provides your doctor with information about the size and shape of your heart and how well your heart's chambers and valves are working. This procedure takes approximately one hour. There are no restrictions for this procedure.  A chest x-ray takes a picture of the organs and structures inside the chest, including the heart, lungs, and blood vessels. This test can show several things, including, whether the heart is enlarges; whether fluid is building up in the lungs; and whether pacemaker / defibrillator leads are still in place. [place]   Your physician recommends that you schedule a follow-up appointment in: 11/17/13 AT 2:45

## 2013-10-22 ENCOUNTER — Telehealth: Payer: Self-pay | Admitting: *Deleted

## 2013-10-22 DIAGNOSIS — Z79899 Other long term (current) drug therapy: Secondary | ICD-10-CM

## 2013-10-22 DIAGNOSIS — E871 Hypo-osmolality and hyponatremia: Secondary | ICD-10-CM

## 2013-10-22 DIAGNOSIS — D649 Anemia, unspecified: Secondary | ICD-10-CM

## 2013-10-22 MED ORDER — FUROSEMIDE 20 MG PO TABS
ORAL_TABLET | ORAL | Status: DC
Start: 2013-10-22 — End: 2013-12-09

## 2013-10-22 NOTE — Telephone Encounter (Signed)
Message copied by Earvin Hansen on Fri Oct 22, 2013 11:10 AM ------      Message from: Darlin Coco      Created: Wed Oct 20, 2013  8:38 PM       Please report.  The serum sodium is very low . Suggests probable SIADH from the HCTZ 50 mg daily. The kidneys are dry with elevated BUN. Stop HCTZ. Restart lasix 20 mg just MWF      LFTs are normal. Lipids okay with LDL 97. There is mild anemia but improved from 5 month ago. Would add multivitamin with iron. The thyroid replacement dose is adequate.      Recheck BMET and CBC (H) in two weeks. ------

## 2013-10-22 NOTE — Telephone Encounter (Signed)
Message copied by Earvin Hansen on Fri Oct 22, 2013 11:11 AM ------      Message from: Darlin Coco      Created: Wed Oct 20, 2013  8:27 PM       Chest xray is satisfactory. The heart is enlarged. The lungs do not show any CHF. ------

## 2013-10-22 NOTE — Telephone Encounter (Signed)
Did check with  Dr. Mare Ferrari and labs ok to wait until 5/18 (not 5/8)

## 2013-10-22 NOTE — Telephone Encounter (Signed)
Yes.  Okay to wait until May 8 to recheck labs

## 2013-10-22 NOTE — Telephone Encounter (Signed)
Message copied by Earvin Hansen on Fri Oct 22, 2013 11:05 AM ------      Message from: Darlin Coco      Created: Wed Oct 20, 2013  8:38 PM       Please report.  The serum sodium is very low . Suggests probable SIADH from the HCTZ 50 mg daily. The kidneys are dry with elevated BUN. Stop HCTZ. Restart lasix 20 mg just MWF      LFTs are normal. Lipids okay with LDL 97. There is mild anemia but improved from 5 month ago. Would add multivitamin with iron. The thyroid replacement dose is adequate.      Recheck BMET and CBC (H) in two weeks. ------

## 2013-10-22 NOTE — Telephone Encounter (Signed)
Advised patient of chest xray, labs, and medication changes. Patient verbalized understanding.  Patient states she just recently started on multivitamin with iron

## 2013-11-08 ENCOUNTER — Other Ambulatory Visit (INDEPENDENT_AMBULATORY_CARE_PROVIDER_SITE_OTHER): Payer: Medicare Other

## 2013-11-08 ENCOUNTER — Ambulatory Visit (HOSPITAL_COMMUNITY): Payer: Medicare Other | Attending: Cardiology | Admitting: Cardiology

## 2013-11-08 DIAGNOSIS — R0602 Shortness of breath: Secondary | ICD-10-CM

## 2013-11-08 DIAGNOSIS — I4891 Unspecified atrial fibrillation: Secondary | ICD-10-CM

## 2013-11-08 DIAGNOSIS — E871 Hypo-osmolality and hyponatremia: Secondary | ICD-10-CM

## 2013-11-08 DIAGNOSIS — D649 Anemia, unspecified: Secondary | ICD-10-CM

## 2013-11-08 DIAGNOSIS — R0609 Other forms of dyspnea: Secondary | ICD-10-CM | POA: Insufficient documentation

## 2013-11-08 DIAGNOSIS — R0989 Other specified symptoms and signs involving the circulatory and respiratory systems: Principal | ICD-10-CM | POA: Insufficient documentation

## 2013-11-08 DIAGNOSIS — Z79899 Other long term (current) drug therapy: Secondary | ICD-10-CM

## 2013-11-08 LAB — CBC WITH DIFFERENTIAL/PLATELET
BASOS ABS: 0 10*3/uL (ref 0.0–0.1)
Basophils Relative: 0.3 % (ref 0.0–3.0)
EOS ABS: 0.1 10*3/uL (ref 0.0–0.7)
Eosinophils Relative: 1.8 % (ref 0.0–5.0)
HCT: 31.6 % — ABNORMAL LOW (ref 36.0–46.0)
HEMOGLOBIN: 10.4 g/dL — AB (ref 12.0–15.0)
LYMPHS PCT: 13 % (ref 12.0–46.0)
Lymphs Abs: 0.7 10*3/uL (ref 0.7–4.0)
MCHC: 32.8 g/dL (ref 30.0–36.0)
MCV: 96.1 fl (ref 78.0–100.0)
MONOS PCT: 10.4 % (ref 3.0–12.0)
Monocytes Absolute: 0.6 10*3/uL (ref 0.1–1.0)
NEUTROS ABS: 4 10*3/uL (ref 1.4–7.7)
NEUTROS PCT: 74.5 % (ref 43.0–77.0)
PLATELETS: 173 10*3/uL (ref 150.0–400.0)
RBC: 3.29 Mil/uL — ABNORMAL LOW (ref 3.87–5.11)
RDW: 16.6 % — AB (ref 11.5–15.5)
WBC: 5.3 10*3/uL (ref 4.0–10.5)

## 2013-11-08 LAB — BASIC METABOLIC PANEL
BUN: 30 mg/dL — ABNORMAL HIGH (ref 6–23)
CHLORIDE: 96 meq/L (ref 96–112)
CO2: 25 meq/L (ref 19–32)
CREATININE: 1.4 mg/dL — AB (ref 0.4–1.2)
Calcium: 8.8 mg/dL (ref 8.4–10.5)
GFR: 39.5 mL/min — ABNORMAL LOW (ref >60.00)
GLUCOSE: 95 mg/dL (ref 70–99)
Potassium: 4.3 mEq/L (ref 3.5–5.1)
Sodium: 129 mEq/L — ABNORMAL LOW (ref 135–145)

## 2013-11-08 NOTE — Progress Notes (Signed)
Echo performed. 

## 2013-11-12 ENCOUNTER — Telehealth: Payer: Self-pay | Admitting: *Deleted

## 2013-11-12 ENCOUNTER — Encounter: Payer: Self-pay | Admitting: Cardiology

## 2013-11-12 DIAGNOSIS — Z79899 Other long term (current) drug therapy: Secondary | ICD-10-CM

## 2013-11-12 NOTE — Telephone Encounter (Signed)
Message copied by Earvin Hansen on Fri Nov 12, 2013 11:40 AM ------      Message from: Darlin Coco      Created: Tue Nov 09, 2013  6:21 PM       Please report.  The echo shows normal systolic function with EF 50-55%. There is moderate mitral regurgitation and there is elevation of pulmonary artery pressure. There is left atrial enlargement. ------

## 2013-11-12 NOTE — Telephone Encounter (Signed)
Message copied by Earvin Hansen on Fri Nov 12, 2013 11:38 AM ------      Message from: Darlin Coco      Created: Tue Nov 09, 2013  6:28 PM       Please report.  The serum sodium is improved. The creatinine is increased.  I would like her to decrease her lasix to just twice a week.      Her hemoglobin is slightly lower. I want her to stay on her iron tablets.      I want her to restart xarelto in preparation for DCCV.  We will use renal dose xarelto 15 mg daily. Get CBC in 1 week to be sure Hgb is not falling further. See in 3 weeks for OV EKG BMET and CBC. Advise her to notify us for any sign of bleeding on xarelto. ------

## 2013-11-12 NOTE — Telephone Encounter (Signed)
Message copied by Earvin Hansen on Fri Nov 12, 2013 11:40 AM ------      Message from: Darlin Coco      Created: Tue Nov 09, 2013  6:28 PM       Please report.  The serum sodium is improved. The creatinine is increased.  I would like her to decrease her lasix to just twice a week.      Her hemoglobin is slightly lower. I want her to stay on her iron tablets.      I want her to restart xarelto in preparation for DCCV.  We will use renal dose xarelto 15 mg daily. Get CBC in 1 week to be sure Hgb is not falling further. See in 3 weeks for OV EKG BMET and CBC. Advise her to notify us for any sign of bleeding on xarelto. ------

## 2013-11-12 NOTE — Telephone Encounter (Signed)
Follow up ° °Pt returned call  °

## 2013-11-12 NOTE — Telephone Encounter (Signed)
Advised patient, wrote changes down and read back to me.

## 2013-11-12 NOTE — Addendum Note (Signed)
Addended by: Alvina Filbert B on: 11/12/2013 12:51 PM   Modules accepted: Orders

## 2013-11-12 NOTE — Telephone Encounter (Signed)
This encounter was created in error - please disregard.

## 2013-11-12 NOTE — Telephone Encounter (Signed)
Patient does not want to hold [name] and start Xarelto. Did recommend starting Xarelto and stopping [name] but she wanted to keep ov scheduled for 5/27 and discuss with  Dr. Mare Ferrari at that time

## 2013-11-12 NOTE — Telephone Encounter (Signed)
When I tried to fill Xarelto high alert came over for [name] and Xarelto interaction.

## 2013-11-15 NOTE — Telephone Encounter (Signed)
Will discuss at [place] 11/17/13.

## 2013-11-17 ENCOUNTER — Encounter: Payer: Self-pay | Admitting: Cardiology

## 2013-11-17 ENCOUNTER — Ambulatory Visit (INDEPENDENT_AMBULATORY_CARE_PROVIDER_SITE_OTHER): Payer: Medicare Other | Admitting: Cardiology

## 2013-11-17 VITALS — BP 130/58 | HR 68 | Ht 65.0 in | Wt 149.0 lb

## 2013-11-17 DIAGNOSIS — D649 Anemia, unspecified: Secondary | ICD-10-CM

## 2013-11-17 DIAGNOSIS — I4891 Unspecified atrial fibrillation: Secondary | ICD-10-CM

## 2013-11-17 DIAGNOSIS — I340 Nonrheumatic mitral (valve) insufficiency: Secondary | ICD-10-CM

## 2013-11-17 DIAGNOSIS — I059 Rheumatic mitral valve disease, unspecified: Secondary | ICD-10-CM

## 2013-11-17 DIAGNOSIS — I119 Hypertensive heart disease without heart failure: Secondary | ICD-10-CM

## 2013-11-17 NOTE — Assessment & Plan Note (Signed)
The patient is not having any symptoms of orthopnea or paroxysmal nocturnal dyspnea or CHF or pedal edema

## 2013-11-17 NOTE — Assessment & Plan Note (Signed)
She has not been aware of any hematochezia or melena.  Her red blood cells are normochromic normocytic.  We are rechecking a CBC today.

## 2013-11-17 NOTE — Assessment & Plan Note (Signed)
She is still in atrial fibrillation clinically.  She is not yet on Xarelto.  Our plan will be to check a hemoglobin to be sure she is stable and then restart renal dose Xarelto.  After one month of Xarelto we will plan for an outpatient cardioversion.

## 2013-11-17 NOTE — Progress Notes (Signed)
Beth [name] Date of Birth:  1935-12-13 [place] [address] [place], [place]  [address] [phone]        Fax   [phone]   History of Present Illness: This pleasant 78 year old woman is seen by me in the office for a followup office visit.Marland Kitchen She was self-referred. She previously had had her cardiology followup at [facility] but wished to make a change. She has a past history of having had surgery for an ascending and transverse thoracic aortic aneurysm treated with endograft in February 2006 at [facility]. In November 2009 she had surgery on a descending thoracic aortic aneurysm also at [facility]. In 2010 she underwent percutaneous repair of her endograft. She had coronary angiography in 2006 which showed smooth and normal coronary arteries.  She had a past history of mild but not severe hypertension. She was a remote smoker but not for 40 years. There is no family history of aneurysms.  The patient also has a history of atrial fibrillation. She has had 2 cardioversions, most recently in November 2014. Her initial cardioversion lasted only 2 months before she reverted to atrial fibrillation. She was then placed on flecainide and the second cardioversion in November 2014. She remained in normal sinus rhythm until earlier this month when she was noted to be back in atrial fibrillation. She previously was on Xarelto. However after her second cardioversion in November 2014 she went down to the beach feeling well. While at the beach she states that she went into heart failure and renal failure. She was told that her heart rate was extremely slow because of the medication. The dose of her flecainide was cut in half and the dose of her metoprolol was cut in half and she improved. She returned home and within several days had rectal bleeding and was followed by gastroenterology in [place]. She was told that the bleeding was from an ulcerated area of the  rectum which they thought was secondary to her previous heart failure and renal failure. Some time in that interval of time her Xarelto was stopped and has not been restarted. She has not had any subsequent evidence of GI bleeding. She has been on Protonix. She is also on [name] for arthritis. She has not been experiencing any chest pain or angina. She has had more exertional dyspnea over the past year. She had to give up playing tennis about a year ago because of the dyspnea and fatigue. The patient had an echocardiogram on 11/08/13 which showed an ejection fraction of 50-55% and there was moderate mitral regurgitation and mild pulmonary artery hypertension and there was left atrial enlargement. Since last visit she has has less dyspnea.  She denies chest pain.  She is aware that her heart is skipping and is irregular.   Current Outpatient Prescriptions  Medication Sig Dispense Refill  . acetaminophen (TYLENOL) 500 MG tablet Take 500 mg by mouth as needed.      Marland Kitchen aspirin 81 MG tablet Take 81 mg by mouth daily.      . cetirizine (ZYRTEC) 10 MG tablet Take 10 mg by mouth as needed.      . flecainide (TAMBOCOR) 50 MG tablet Take 50 mg by mouth 2 (two) times daily.      . fluticasone (FLONASE) 50 MCG/ACT nasal spray Place 1 spray into both nostrils daily as needed.      . furosemide (LASIX) 20 MG tablet 1 TABLET ON Monday, Wednesday, AND  Friday ONLY  20 tablet  3  . levothyroxine (SYNTHROID, LEVOTHROID) 75 MCG tablet Take 75 mcg by mouth daily before breakfast.      . loratadine (CLARITIN) 10 MG tablet Take 1 tablet (10 mg total) by mouth daily.  30 tablet  0  . losartan (COZAAR) 100 MG tablet Take 100 mg by mouth daily.      . metoprolol succinate (TOPROL-XL) 25 MG 24 hr tablet Take 25 mg by mouth daily.      . Multiple Vitamins-Iron (MULTIVITAMIN/IRON PO) Take by mouth daily.      . pantoprazole (PROTONIX) 40 MG tablet Take 40 mg by mouth daily.      . piroxicam ([name]) 20 MG capsule Take 20 mg  by mouth daily.      . predniSONE (DELTASONE) 10 MG tablet Take 10 mg by mouth as needed.      Marland Kitchen HYDROcodone-acetaminophen (HYCET) 7.5-325 mg/15 ml solution Take 10 mLs by mouth every 6 (six) hours as needed (sore throat).  120 mL  0   No current facility-administered medications for this visit.    No Known Allergies  Patient Active Problem List   Diagnosis Date Noted  . Mitral regurgitation 11/17/2013  . Normocytic anemia 11/17/2013  . Atrial fibrillation 10/20/2013  . DOE (dyspnea on exertion) 10/20/2013  . Benign hypertensive heart disease without heart failure 10/20/2013  . Thoracic ascending aortic aneurysm 10/20/2013  . Descending thoracic aortic aneurysm 10/20/2013  . Malaise and fatigue 10/20/2013  . Hypothyroid 10/20/2013    History  Smoking status  . Former Smoker  Smokeless tobacco  . Not on file    History  Alcohol Use No    No family history on file.  Review of Systems: Constitutional: no fever chills diaphoresis or fatigue or change in weight.  Head and neck: no hearing loss, no epistaxis, no photophobia or visual disturbance. Respiratory: No cough, shortness of breath or wheezing. Cardiovascular: No chest pain peripheral edema, palpitations. Gastrointestinal: No abdominal distention, no abdominal pain, no change in bowel habits hematochezia or melena. Genitourinary: No dysuria, no frequency, no urgency, no nocturia. Musculoskeletal:No arthralgias, no back pain, no gait disturbance or myalgias. Neurological: No dizziness, no headaches, no numbness, no seizures, no syncope, no weakness, no tremors. Hematologic: No lymphadenopathy, no easy bruising. Psychiatric: No confusion, no hallucinations, no sleep disturbance.    Physical Exam: Filed Vitals:   11/17/13 1512  BP: 130/58  Pulse: 68   the general appearance reveals a well-developed well-nourished healthy-appearing woman in no distress.The head and neck exam reveals pupils equal and reactive.   Extraocular movements are full.  There is no scleral icterus.  The mouth and pharynx are normal.  The neck is supple.  The carotids reveal no bruits.  The jugular venous pressure is normal.  The  thyroid is not enlarged.  There is no lymphadenopathy.  The chest is clear to percussion and auscultation.  There are no rales or rhonchi.  Expansion of the chest is symmetrical.  The precordium is quiet.  The pulse is irregularly irregular  The first heart sound is normal.  The second heart sound is physiologically split.  There is a grade 2/6 systolic ejection murmur at the base and a soft grade 1/6 apical holosystolic murmur of mitral regurgitation at the apex.  There is no abnormal lift or heave.  The abdomen is soft and nontender.  The bowel sounds are normal.  The liver and spleen are not enlarged.  There are no abdominal masses.  There are no abdominal bruits.  Extremities reveal good pedal pulses.  There is no phlebitis or edema.  There is no cyanosis or clubbing.  Strength is normal and symmetrical in all extremities.  There is no lateralizing weakness.  There are no sensory deficits.  The skin is warm and dry.  There is no rash.     Assessment / Plan: 1.  Atrial fibrillation 2. benign hypertensive heart disease without heart failure 3. normocytic anemia 4.  Thoracic ascending aortic aneurysm 5. descending thoracic aortic aneurysm 6. Hypothyroidism 7. malaise and fatigue  Plan: We are checking a CBC and basal metabolic panel today.  We will start renal dose of Xarelto if labs are stable.  I talked to her about elective cardioversion and she would like to proceed in about a month after being on a reliable Xarelto for 1 month.  Recheck in one month for office visit EKG CBC and basal metabolic panel

## 2013-11-17 NOTE — Patient Instructions (Signed)
Will obtain labs today and call you with the results (cbc/bmet)  Your physician recommends that you continue on your current medications as directed. Please refer to the Current Medication list given to you today.  Your physician recommends that you schedule a follow-up appointment in: 1 month [email or web address]

## 2013-11-18 ENCOUNTER — Other Ambulatory Visit: Payer: Medicare Other

## 2013-11-18 LAB — CBC WITH DIFFERENTIAL/PLATELET
Basophils Absolute: 0.1 10*3/uL (ref 0.0–0.1)
Basophils Relative: 1.2 % (ref 0.0–3.0)
Eosinophils Absolute: 0.1 10*3/uL (ref 0.0–0.7)
Eosinophils Relative: 1 % (ref 0.0–5.0)
HEMATOCRIT: 34.3 % — AB (ref 36.0–46.0)
Hemoglobin: 11.4 g/dL — ABNORMAL LOW (ref 12.0–15.0)
LYMPHS ABS: 1 10*3/uL (ref 0.7–4.0)
Lymphocytes Relative: 17.1 % (ref 12.0–46.0)
MCHC: 33.3 g/dL (ref 30.0–36.0)
MCV: 94.7 fl (ref 78.0–100.0)
MONO ABS: 0.8 10*3/uL (ref 0.1–1.0)
MONOS PCT: 12.7 % — AB (ref 3.0–12.0)
Neutro Abs: 4.1 10*3/uL (ref 1.4–7.7)
Neutrophils Relative %: 68 % (ref 43.0–77.0)
PLATELETS: 236 10*3/uL (ref 150.0–400.0)
RBC: 3.62 Mil/uL — ABNORMAL LOW (ref 3.87–5.11)
RDW: 17.2 % — ABNORMAL HIGH (ref 11.5–15.5)
WBC: 6 10*3/uL (ref 4.0–10.5)

## 2013-11-18 LAB — BASIC METABOLIC PANEL
BUN: 23 mg/dL (ref 6–23)
CO2: 30 mEq/L (ref 19–32)
Calcium: 9.2 mg/dL (ref 8.4–10.5)
Chloride: 100 mEq/L (ref 96–112)
Creatinine, Ser: 1.3 mg/dL — ABNORMAL HIGH (ref 0.4–1.2)
GFR: 40.86 mL/min — ABNORMAL LOW (ref >60.00)
GLUCOSE: 87 mg/dL (ref 70–99)
POTASSIUM: 4.4 meq/L (ref 3.5–5.1)
Sodium: 136 mEq/L (ref 135–145)

## 2013-11-19 ENCOUNTER — Telehealth: Payer: Self-pay | Admitting: *Deleted

## 2013-11-19 DIAGNOSIS — I4891 Unspecified atrial fibrillation: Secondary | ICD-10-CM

## 2013-11-19 MED ORDER — RIVAROXABAN 15 MG PO TABS
15.0000 mg | ORAL_TABLET | Freq: Every day | ORAL | Status: DC
Start: 2013-11-19 — End: 2015-10-07

## 2013-11-19 NOTE — Telephone Encounter (Signed)
Advised patient

## 2013-11-19 NOTE — Telephone Encounter (Signed)
Stop aspirin while on Xarelto.  Use [name] only very sparingly and try to get by with Tylenol.  The [name] increases the risk of bleeding.

## 2013-11-19 NOTE — Telephone Encounter (Signed)
Message copied by Earvin Hansen on Fri Nov 19, 2013  9:36 AM ------      Message from: Darlin Coco      Created: Thu Nov 18, 2013  2:47 PM       Her serum sodium is significantly improved and is now normal.  Her kidney function has improved but is still below normal.  Her hemoglobin has come back up to 11.4.  Okay for her to start Xarelto 15 mg daily in preparation for direct-current cardioversion in about a month.  Extremely important that she remembers to take the Xarelto every day. ------

## 2013-12-07 ENCOUNTER — Ambulatory Visit: Payer: Medicare Other | Admitting: Cardiology

## 2013-12-09 ENCOUNTER — Encounter (HOSPITAL_BASED_OUTPATIENT_CLINIC_OR_DEPARTMENT_OTHER): Payer: Self-pay | Admitting: Emergency Medicine

## 2013-12-09 ENCOUNTER — Inpatient Hospital Stay (HOSPITAL_BASED_OUTPATIENT_CLINIC_OR_DEPARTMENT_OTHER)
Admission: EM | Admit: 2013-12-09 | Discharge: 2013-12-11 | DRG: 292 | Disposition: A | Discharge status: Home / Self Care | Payer: Medicare Other | Attending: Cardiovascular Disease | Admitting: Cardiovascular Disease

## 2013-12-09 ENCOUNTER — Emergency Department (HOSPITAL_BASED_OUTPATIENT_CLINIC_OR_DEPARTMENT_OTHER): Payer: Medicare Other

## 2013-12-09 DIAGNOSIS — N390 Urinary tract infection, site not specified: Secondary | ICD-10-CM | POA: Diagnosis present

## 2013-12-09 DIAGNOSIS — I480 Paroxysmal atrial fibrillation: Secondary | ICD-10-CM

## 2013-12-09 DIAGNOSIS — Z7901 Long term (current) use of anticoagulants: Secondary | ICD-10-CM

## 2013-12-09 DIAGNOSIS — J810 Acute pulmonary edema: Secondary | ICD-10-CM

## 2013-12-09 DIAGNOSIS — Z87891 Personal history of nicotine dependence: Secondary | ICD-10-CM

## 2013-12-09 DIAGNOSIS — I712 Thoracic aortic aneurysm, without rupture, unspecified: Secondary | ICD-10-CM | POA: Diagnosis present

## 2013-12-09 DIAGNOSIS — I7121 Aneurysm of the ascending aorta, without rupture: Secondary | ICD-10-CM | POA: Diagnosis present

## 2013-12-09 DIAGNOSIS — I4819 Other persistent atrial fibrillation: Secondary | ICD-10-CM

## 2013-12-09 DIAGNOSIS — I509 Heart failure, unspecified: Secondary | ICD-10-CM | POA: Diagnosis present

## 2013-12-09 DIAGNOSIS — I7123 Aneurysm of the descending thoracic aorta, without rupture: Secondary | ICD-10-CM | POA: Diagnosis present

## 2013-12-09 DIAGNOSIS — I119 Hypertensive heart disease without heart failure: Secondary | ICD-10-CM

## 2013-12-09 DIAGNOSIS — R410 Disorientation, unspecified: Secondary | ICD-10-CM

## 2013-12-09 DIAGNOSIS — I129 Hypertensive chronic kidney disease with stage 1 through stage 4 chronic kidney disease, or unspecified chronic kidney disease: Secondary | ICD-10-CM | POA: Diagnosis present

## 2013-12-09 DIAGNOSIS — Z853 Personal history of malignant neoplasm of breast: Secondary | ICD-10-CM

## 2013-12-09 DIAGNOSIS — M129 Arthropathy, unspecified: Secondary | ICD-10-CM | POA: Diagnosis present

## 2013-12-09 DIAGNOSIS — N183 Chronic kidney disease, stage 3 unspecified: Secondary | ICD-10-CM | POA: Diagnosis present

## 2013-12-09 DIAGNOSIS — E079 Disorder of thyroid, unspecified: Secondary | ICD-10-CM | POA: Diagnosis present

## 2013-12-09 DIAGNOSIS — I5031 Acute diastolic (congestive) heart failure: Secondary | ICD-10-CM | POA: Diagnosis present

## 2013-12-09 DIAGNOSIS — I340 Nonrheumatic mitral (valve) insufficiency: Secondary | ICD-10-CM

## 2013-12-09 DIAGNOSIS — Z79899 Other long term (current) drug therapy: Secondary | ICD-10-CM

## 2013-12-09 DIAGNOSIS — I4891 Unspecified atrial fibrillation: Secondary | ICD-10-CM | POA: Diagnosis present

## 2013-12-09 DIAGNOSIS — I5033 Acute on chronic diastolic (congestive) heart failure: Principal | ICD-10-CM | POA: Diagnosis present

## 2013-12-09 DIAGNOSIS — E039 Hypothyroidism, unspecified: Secondary | ICD-10-CM | POA: Diagnosis present

## 2013-12-09 DIAGNOSIS — IMO0002 Reserved for concepts with insufficient information to code with codable children: Secondary | ICD-10-CM

## 2013-12-09 HISTORY — DX: Hypomagnesemia: E83.42

## 2013-12-09 HISTORY — DX: Urinary tract infection, site not specified: N39.0

## 2013-12-09 HISTORY — DX: Acute diastolic (congestive) heart failure: I50.31

## 2013-12-09 LAB — URINALYSIS, ROUTINE W REFLEX MICROSCOPIC
Bilirubin Urine: NEGATIVE
Glucose, UA: NEGATIVE mg/dL
KETONES UR: NEGATIVE mg/dL
NITRITE: POSITIVE — AB
PROTEIN: NEGATIVE mg/dL
Specific Gravity, Urine: 1.008 (ref 1.005–1.030)
Urobilinogen, UA: 0.2 mg/dL (ref 0.0–1.0)
pH: 6 (ref 5.0–8.0)

## 2013-12-09 LAB — CBC WITH DIFFERENTIAL/PLATELET
BASOS ABS: 0 10*3/uL (ref 0.0–0.1)
Basophils Relative: 0 % (ref 0–1)
Eosinophils Absolute: 0.2 10*3/uL (ref 0.0–0.7)
Eosinophils Relative: 4 % (ref 0–5)
HCT: 34.3 % — ABNORMAL LOW (ref 36.0–46.0)
Hemoglobin: 11.4 g/dL — ABNORMAL LOW (ref 12.0–15.0)
LYMPHS ABS: 1 10*3/uL (ref 0.7–4.0)
LYMPHS PCT: 19 % (ref 12–46)
MCH: 32.1 pg (ref 26.0–34.0)
MCHC: 33.2 g/dL (ref 30.0–36.0)
MCV: 96.6 fL (ref 78.0–100.0)
Monocytes Absolute: 0.7 10*3/uL (ref 0.1–1.0)
Monocytes Relative: 12 % (ref 3–12)
Neutro Abs: 3.4 10*3/uL (ref 1.7–7.7)
Neutrophils Relative %: 64 % (ref 43–77)
PLATELETS: 203 10*3/uL (ref 150–400)
RBC: 3.55 MIL/uL — AB (ref 3.87–5.11)
RDW: 16 % — AB (ref 11.5–15.5)
WBC: 5.3 10*3/uL (ref 4.0–10.5)

## 2013-12-09 LAB — TROPONIN I
Troponin I: <0.3 ng/mL (ref <0.30)
Troponin I: <0.3 ng/mL (ref <0.30)
Troponin I: <0.3 ng/mL (ref <0.30)

## 2013-12-09 LAB — MAGNESIUM: Magnesium: 1.5 mg/dL (ref 1.5–2.5)

## 2013-12-09 LAB — URINE MICROSCOPIC-ADD ON

## 2013-12-09 LAB — BASIC METABOLIC PANEL
BUN: 26 mg/dL — ABNORMAL HIGH (ref 6–23)
CO2: 23 meq/L (ref 19–32)
Calcium: 9.5 mg/dL (ref 8.4–10.5)
Chloride: 104 mEq/L (ref 96–112)
Creatinine, Ser: 1.3 mg/dL — ABNORMAL HIGH (ref 0.50–1.10)
GFR calc Af Amer: 45 mL/min — ABNORMAL LOW (ref >90)
GFR calc non Af Amer: 39 mL/min — ABNORMAL LOW (ref >90)
Glucose, Bld: 127 mg/dL — ABNORMAL HIGH (ref 70–99)
Potassium: 4.5 mEq/L (ref 3.7–5.3)
SODIUM: 139 meq/L (ref 137–147)

## 2013-12-09 LAB — MRSA PCR SCREENING: MRSA by PCR: NEGATIVE

## 2013-12-09 LAB — PROTIME-INR
INR: 1.5 — AB (ref 0.00–1.49)
PROTHROMBIN TIME: 17.7 s — AB (ref 11.6–15.2)

## 2013-12-09 LAB — T4, FREE: Free T4: 1.63 ng/dL (ref 0.80–1.80)

## 2013-12-09 LAB — PRO B NATRIURETIC PEPTIDE: PRO B NATRI PEPTIDE: 7619 pg/mL — AB (ref 0–450)

## 2013-12-09 LAB — TSH: TSH: 1.15 u[IU]/mL (ref 0.350–4.500)

## 2013-12-09 MED ORDER — TAB-A-VITE/IRON PO TABS
1.0000 | ORAL_TABLET | Freq: Every day | ORAL | Status: DC
  Administered 2013-12-09 – 2013-12-11 (×3): 1 via ORAL
  Filled 2013-12-09 (×3): qty 1

## 2013-12-09 MED ORDER — SODIUM CHLORIDE 0.9 % IV SOLN: 250.0000 mL | INTRAVENOUS | Status: DC | PRN

## 2013-12-09 MED ORDER — MAGNESIUM SULFATE IN D5W 10-5 MG/ML-% IV SOLN
1.0000 g | Freq: Once | INTRAVENOUS | Status: AC
  Administered 2013-12-09: 1 g via INTRAVENOUS
  Filled 2013-12-09: qty 100

## 2013-12-09 MED ORDER — FLECAINIDE ACETATE 100 MG PO TABS
100.0000 mg | ORAL_TABLET | Freq: Two times a day (BID) | ORAL | Status: DC
  Administered 2013-12-09 – 2013-12-11 (×4): 100 mg via ORAL
  Filled 2013-12-09 (×5): qty 1

## 2013-12-09 MED ORDER — SODIUM CHLORIDE 0.9 % IJ SOLN: 3.0000 mL | INTRAMUSCULAR | Status: DC | PRN

## 2013-12-09 MED ORDER — LEVOTHYROXINE SODIUM 75 MCG PO TABS
75.0000 ug | ORAL_TABLET | Freq: Every day | ORAL | Status: DC
  Administered 2013-12-10 – 2013-12-11 (×2): 75 ug via ORAL
  Filled 2013-12-09 (×3): qty 1

## 2013-12-09 MED ORDER — FUROSEMIDE 10 MG/ML IJ SOLN
40.0000 mg | Freq: Once | INTRAMUSCULAR | Status: AC
  Administered 2013-12-09: 40 mg via INTRAVENOUS
  Filled 2013-12-09: qty 4

## 2013-12-09 MED ORDER — METOPROLOL SUCCINATE ER 25 MG PO TB24
25.0000 mg | ORAL_TABLET | Freq: Every day | ORAL | Status: DC
  Administered 2013-12-09 – 2013-12-11 (×3): 25 mg via ORAL
  Filled 2013-12-09 (×3): qty 1

## 2013-12-09 MED ORDER — DEXTROSE 5 % IV SOLN
1.0000 g | Freq: Once | INTRAVENOUS | Status: AC
  Administered 2013-12-09: 1 g via INTRAVENOUS

## 2013-12-09 MED ORDER — PANTOPRAZOLE SODIUM 40 MG PO TBEC
40.0000 mg | DELAYED_RELEASE_TABLET | Freq: Every day | ORAL | Status: DC
  Administered 2013-12-09 – 2013-12-11 (×3): 40 mg via ORAL
  Filled 2013-12-09 (×3): qty 1

## 2013-12-09 MED ORDER — DEXTROSE 5 % IV SOLN
1.0000 g | INTRAVENOUS | Status: DC
  Administered 2013-12-10: 1 g via INTRAVENOUS
  Filled 2013-12-09 (×2): qty 10

## 2013-12-09 MED ORDER — FUROSEMIDE 10 MG/ML IJ SOLN
40.0000 mg | Freq: Every day | INTRAMUSCULAR | Status: DC
  Administered 2013-12-09: 40 mg via INTRAVENOUS
  Filled 2013-12-09: qty 4

## 2013-12-09 MED ORDER — ALBUTEROL SULFATE HFA 108 (90 BASE) MCG/ACT IN AERS
4.0000 | INHALATION_SPRAY | Freq: Once | RESPIRATORY_TRACT | Status: AC
  Administered 2013-12-09: 4 via RESPIRATORY_TRACT
  Filled 2013-12-09: qty 6.7

## 2013-12-09 MED ORDER — HYDRALAZINE HCL 25 MG PO TABS
25.0000 mg | ORAL_TABLET | Freq: Three times a day (TID) | ORAL | Status: DC
  Administered 2013-12-09 – 2013-12-11 (×6): 25 mg via ORAL
  Filled 2013-12-09 (×9): qty 1

## 2013-12-09 MED ORDER — SODIUM CHLORIDE 0.9 % IJ SOLN: 3.0000 mL | Freq: Two times a day (BID) | INTRAMUSCULAR | Status: DC

## 2013-12-09 MED ORDER — CEFTRIAXONE SODIUM 1 G IJ SOLR
INTRAMUSCULAR | Status: AC
  Filled 2013-12-09: qty 10

## 2013-12-09 MED ORDER — LOSARTAN POTASSIUM 50 MG PO TABS
100.0000 mg | ORAL_TABLET | Freq: Every day | ORAL | Status: DC
  Administered 2013-12-09 – 2013-12-11 (×3): 100 mg via ORAL
  Filled 2013-12-09 (×3): qty 2

## 2013-12-09 MED ORDER — DILTIAZEM HCL 25 MG/5ML IV SOLN
10.0000 mg | Freq: Once | INTRAVENOUS | Status: AC
  Administered 2013-12-09: 10 mg via INTRAVENOUS
  Filled 2013-12-09: qty 5

## 2013-12-09 MED ORDER — RIVAROXABAN 15 MG PO TABS
15.0000 mg | ORAL_TABLET | Freq: Every day | ORAL | Status: DC
  Administered 2013-12-09 – 2013-12-10 (×2): 15 mg via ORAL
  Filled 2013-12-09 (×3): qty 1

## 2013-12-09 MED ORDER — LEVOTHYROXINE SODIUM 75 MCG PO TABS
75.0000 ug | ORAL_TABLET | Freq: Every day | ORAL | Status: DC
  Filled 2013-12-09: qty 1

## 2013-12-09 MED ORDER — ALBUTEROL SULFATE (2.5 MG/3ML) 0.083% IN NEBU
INHALATION_SOLUTION | RESPIRATORY_TRACT | Status: AC
  Administered 2013-12-09: 2.5 mg via RESPIRATORY_TRACT
  Filled 2013-12-09: qty 6

## 2013-12-09 NOTE — ED Notes (Signed)
[facility] has been notified of bed 2H16C---gave to [facility]

## 2013-12-09 NOTE — Progress Notes (Addendum)
Pharmacy clarified that patient's dose of flecainide was in fact 136m BID and patient was taking this much flecainide. Last office note was likely wrong - I discussed with patient's primary cardiologist Dr. Mare Ferrari who would like to continue flecainide at 100mg  BID as he believes that this was what the patient was most likely actually taking prior to admission. Dayna Dunn PA-C  Mg 1.5 - will replete with 1g, conservative dosing given renal insufficiency. Dayna Dunn PA-C

## 2013-12-09 NOTE — ED Notes (Signed)
Pt placed on o2 at 2lpm via n/c for comfort and sats of 93%.

## 2013-12-09 NOTE — ED Notes (Signed)
[facility] staff at bedside, care transferred. Pt is aware of plan to transfer her to 2 heart room #16.

## 2013-12-09 NOTE — ED Notes (Signed)
Pt requests that her wallet be secured due to large amt of cash and credit cards. Tom, security, alerted, will come secure pt wallet and contents.

## 2013-12-09 NOTE — Progress Notes (Signed)
DCCV scheduled at 06/19 at 11:00 am for Dr. Martinique at bedside.

## 2013-12-09 NOTE — ED Notes (Signed)
Report received from sam, rn. Pt lying supine with resp easy and regular. Pt initially denies any c/o, after further questioning and assessment states that she feels "dizzy". Denies any cp, sob or other c/o at this time.

## 2013-12-09 NOTE — ED Notes (Signed)
Family at bedside. 

## 2013-12-09 NOTE — ED Notes (Signed)
Tom, security, is at bedside securing pt valuables.

## 2013-12-09 NOTE — ED Notes (Signed)
Pt woke from sleep 30 mins ago with diff breathing and wheezing. Pt denies cold or cough, unable to talk in complete sentences. Denies fever, audible wheezing in triage.

## 2013-12-09 NOTE — ED Notes (Signed)
BSC placed at bedside, pt aware of need for urine sample. Pt does not know year or month, this rn assisted pt to call her daughter a few minutes ago, pt again requests to call her daughter. When I tell her that we called a left a message already, pt states "oh. I don't remember that.".

## 2013-12-09 NOTE — Progress Notes (Addendum)
ANTIBIOTIC CONSULT NOTE - INITIAL  Pharmacy Consult for rocephin Indication: UTI  No Known Allergies  Patient Measurements: Weight: 151 lb 4.8 oz (68.629 kg) Adjusted Body Weight:   Vital Signs: Temp: 97.7 F (36.5 C) (06/18 1212) Temp src: Oral (06/18 1212) BP: 173/76 mmHg (06/18 1216) Pulse Rate: 78 (06/18 1212) Intake/Output from previous day:   Intake/Output from this shift: Total I/O In: -  Out: 1250 [Urine:1250]  Labs:  Recent Labs  12/09/13 0655  WBC 5.3  HGB 11.4*  PLT 203  CREATININE 1.30*   The CrCl is unknown because both a height and weight (above a minimum accepted value) are required for this calculation. No results found for this basename: VANCOTROUGH, VANCOPEAK, VANCORANDOM, GENTTROUGH, GENTPEAK, GENTRANDOM, TOBRATROUGH, TOBRAPEAK, TOBRARND, AMIKACINPEAK, AMIKACINTROU, AMIKACIN,  in the last 72 hours   Microbiology: No results found for this or any previous visit (from the past 720 hour(s)).  Medical History: Past Medical History  Diagnosis Date  . Atrial fibrillation   . Hypertension   . Thyroid disease   . Aortic aneurysm with dissection   . Breast CA   . Acute renal failure (ARF)   . CHF (congestive heart failure)   . Aortic aneurysm, descending     Medications:  Prescriptions prior to admission  Medication Sig Dispense Refill  . furosemide (LASIX) 20 MG tablet Take 20 mg by mouth 3 (three) times a week. Monday, wed, and fridays      . levothyroxine (SYNTHROID, LEVOTHROID) 75 MCG tablet Take 75 mcg by mouth daily before breakfast.      . losartan (COZAAR) 100 MG tablet Take 100 mg by mouth daily.      . metoprolol succinate (TOPROL-XL) 25 MG 24 hr tablet Take 25 mg by mouth daily.      . Multiple Vitamins-Iron (MULTIVITAMIN/IRON PO) Take by mouth daily.      . pantoprazole (PROTONIX) 40 MG tablet Take 40 mg by mouth daily.      . piroxicam ([name]) 20 MG capsule Take 20 mg by mouth as needed. To use sparingly      . Rivaroxaban  (XARELTO) 15 MG TABS tablet Take 1 tablet (15 mg total) by mouth daily with supper.  30 tablet  5   Scheduled:  . [START ON 12/10/2013] cefTRIAXone (ROCEPHIN)  IV  1 g Intravenous Q24H  . furosemide  40 mg Intravenous Daily  . hydrALAZINE  25 mg Oral 3 times per day  . levothyroxine  75 mcg Oral QAC breakfast  . losartan  100 mg Oral Daily  . metoprolol succinate  25 mg Oral Daily  . multivitamins with iron  1 tablet Oral Daily  . pantoprazole  40 mg Oral Daily  . Rivaroxaban  15 mg Oral Q supper  . sodium chloride  3 mL Intravenous Q12H   Infusions:   Assessment: 78 yo who came in with CHF exacerbation. Did a UA and it showed some nitrite and WBC in there. Rocephin has been ordered for the UTI. It doesn't need renal adjustments.    Plan:   Cont rocephin 1g IV q24 Rx will sign off Consider de-escalating to Ancef instead  Addendum:  Asked to clarify flecainide dose. Pt said the dose has not been changed. [facility] said the dose is Flecainide 100mg  BID. I have updated the medrec. Last dose was 12/08/13.   Onnie Boer, PharmD Pager: [phone] 12/09/2013 3:03 PM

## 2013-12-09 NOTE — ED Notes (Signed)
MD at bedside. 

## 2013-12-09 NOTE — ED Notes (Signed)
Pt assisted to BSC, voids approx 300 cc clear yellow urine. Sample sent to lab. Assisted back into bed.

## 2013-12-09 NOTE — ED Provider Notes (Signed)
CSN: [id number]     Arrival date & time 12/09/13  0615 History   First MD Initiated Contact with Patient 12/09/13 [phone]     Chief Complaint  Patient presents with  . Respiratory Distress     (Consider location/radiation/quality/duration/timing/severity/associated sxs/prior Treatment) HPI Comments: Pt presents w/ sudden onset SOB, wheezing, dizziness, weakness that woke her from sleep at about 5:30 AM. Pt drove herself to the ED.  Patient is a 78 y.o. female presenting with shortness of breath. The history is provided by the patient. No language interpreter was used.  Shortness of Breath Severity:  Severe Onset quality:  Sudden Duration:  2 hours Timing:  Constant Progression:  Improving Chronicity:  New Relieved by:  Nothing Worsened by:  Nothing tried Ineffective treatments:  None tried Associated symptoms: no abdominal pain, no chest pain, no cough, no diaphoresis, no fever, no headaches, no neck pain, no sore throat, no sputum production, no syncope, no swollen glands and no vomiting   Associated symptoms comment:  Dizziness, generalized weakness Risk factors comment:  Hx of afib, CHF, descending aortic aneurysm, HTN   Past Medical History  Diagnosis Date  . Atrial fibrillation   . Hypertension   . Thyroid disease   . Aortic aneurysm with dissection   . Breast CA   . Acute renal failure (ARF)   . CHF (congestive heart failure)   . Aortic aneurysm, descending    Past Surgical History  Procedure Laterality Date  . Ascending aortic aneurysm repair    . Descending aortic aneurysm repair    . Breast lumpectomy    . Abdominal hysterectomy     History reviewed. No pertinent family history. History  Substance Use Topics  . Smoking status: Former Research scientist (life sciences)  . Smokeless tobacco: Not on file  . Alcohol Use: No   OB History   Grav Para Term Preterm Abortions TAB SAB Ect Mult Living                 Review of Systems  Constitutional: Negative for fever, chills,  diaphoresis, activity change, appetite change and fatigue.  HENT: Negative for congestion, facial swelling, rhinorrhea and sore throat.   Eyes: Negative for photophobia and discharge.  Respiratory: Positive for shortness of breath. Negative for cough, sputum production and chest tightness.   Cardiovascular: Negative for chest pain, palpitations, leg swelling and syncope.  Gastrointestinal: Negative for nausea, vomiting, abdominal pain and diarrhea.  Endocrine: Negative for polydipsia and polyuria.  Genitourinary: Negative for dysuria, frequency, difficulty urinating and pelvic pain.  Musculoskeletal: Negative for arthralgias, back pain, neck pain and neck stiffness.  Skin: Negative for color change and wound.  Allergic/Immunologic: Negative for immunocompromised state.  Neurological: Positive for dizziness and weakness. Negative for facial asymmetry, numbness and headaches.  Hematological: Does not bruise/bleed easily.  Psychiatric/Behavioral: Negative for confusion and agitation.      Allergies  Review of patient's allergies indicates no known allergies.  Home Medications   Prior to Admission medications   Medication Sig Start Date End Date Taking? Authorizing Provider  furosemide (LASIX) 20 MG tablet 1 TABLET ON Monday, Wednesday, AND Friday ONLY 10/22/13  Yes Darlin Coco, MD  levothyroxine (SYNTHROID, LEVOTHROID) 75 MCG tablet Take 75 mcg by mouth daily before breakfast.   Yes Historical Provider, MD  losartan (COZAAR) 100 MG tablet Take 100 mg by mouth daily.   Yes Historical Provider, MD  metoprolol succinate (TOPROL-XL) 25 MG 24 hr tablet Take 25 mg by mouth daily.   Yes Historical Provider,  MD  pantoprazole (PROTONIX) 40 MG tablet Take 40 mg by mouth daily.   Yes Historical Provider, MD  piroxicam ([name]) 20 MG capsule Take 20 mg by mouth as needed. To use sparingly   Yes Historical Provider, MD  Rivaroxaban (XARELTO) 15 MG TABS tablet Take 1 tablet (15 mg total) by mouth  daily with supper. 11/19/13  Yes Darlin Coco, MD  acetaminophen (TYLENOL) 500 MG tablet Take 500 mg by mouth as needed. 05/07/13   Historical Provider, MD  cetirizine (ZYRTEC) 10 MG tablet Take 10 mg by mouth as needed.    Historical Provider, MD  flecainide (TAMBOCOR) 50 MG tablet Take 50 mg by mouth 2 (two) times daily.    Historical Provider, MD  fluticasone (FLONASE) 50 MCG/ACT nasal spray Place 1 spray into both nostrils daily as needed. 10/11/13   Merryl Hacker, MD  HYDROcodone-acetaminophen (HYCET) 7.5-325 mg/15 ml solution Take 10 mLs by mouth every 6 (six) hours as needed (sore throat). 10/11/13 10/11/14  Merryl Hacker, MD  loratadine (CLARITIN) 10 MG tablet Take 1 tablet (10 mg total) by mouth daily. 10/11/13   Merryl Hacker, MD  Multiple Vitamins-Iron (MULTIVITAMIN/IRON PO) Take by mouth daily.    Historical Provider, MD  predniSONE (DELTASONE) 10 MG tablet Take 10 mg by mouth as needed.    Historical Provider, MD   BP 186/88  Pulse 84  Temp(Src) 98.2 F (36.8 C) (Oral)  Resp 18  SpO2 97% Physical Exam  Constitutional: She appears well-developed and well-nourished. No distress.  HENT:  Head: Normocephalic and atraumatic.  Mouth/Throat: No oropharyngeal exudate.  Eyes: Pupils are equal, round, and reactive to light.  Neck: Normal range of motion. Neck supple.  Cardiovascular: Normal heart sounds.  An irregularly irregular rhythm present. Tachycardia present.  Exam reveals no gallop and no friction rub.   No murmur heard. Pulmonary/Chest: Tachypnea noted. No respiratory distress. She has decreased breath sounds in the right upper field, the right middle field, the right lower field, the left upper field, the left middle field and the left lower field. She has wheezes in the right upper field, the right middle field, the right lower field, the left upper field, the left middle field and the left lower field. She has no rales.  Abdominal: Soft. Bowel sounds are normal.  She exhibits no distension and no mass. There is no tenderness. There is no rebound and no guarding.  Musculoskeletal: Normal range of motion. She exhibits no edema and no tenderness.  Neurological: She is alert. GCS eye subscore is 4. GCS verbal subscore is 4. GCS motor subscore is 6.  Skin: Skin is warm and dry.  Psychiatric: She has a normal mood and affect.    ED Course  Procedures (including critical care time) Labs Review Labs Reviewed  CBC WITH DIFFERENTIAL - Abnormal; Notable for the following:    RBC 3.55 (*)    Hemoglobin 11.4 (*)    HCT 34.3 (*)    RDW 16.0 (*)    All other components within normal limits  BASIC METABOLIC PANEL - Abnormal; Notable for the following:    Glucose, Bld 127 (*)    BUN 26 (*)    Creatinine, Ser 1.30 (*)    GFR calc non Af Amer 39 (*)    GFR calc Af Amer 45 (*)    All other components within normal limits  PROTIME-INR - Abnormal; Notable for the following:    Prothrombin Time 17.7 (*)    INR 1.50 (*)  All other components within normal limits  PRO B NATRIURETIC PEPTIDE - Abnormal; Notable for the following:    Pro B Natriuretic peptide (BNP) 7619.0 (*)    All other components within normal limits  URINALYSIS, ROUTINE W REFLEX MICROSCOPIC - Abnormal; Notable for the following:    Hgb urine dipstick MODERATE (*)    Nitrite POSITIVE (*)    Leukocytes, UA SMALL (*)    All other components within normal limits  URINE MICROSCOPIC-ADD ON - Abnormal; Notable for the following:    Bacteria, UA MANY (*)    All other components within normal limits  URINE CULTURE  TROPONIN I    Imaging Review Dg Chest Portable 1 View  12/09/2013   CLINICAL DATA:  Difficulty breathing and wheezing.  EXAM: PORTABLE CHEST - 1 VIEW  COMPARISON:  Chest radiograph performed 10/20/2013  FINDINGS: The lungs are well-aerated. Vascular congestion is noted, with mildly increased interstitial markings, raising concern for mild interstitial edema. There is no evidence of  pleural effusion or pneumothorax.  The cardiomediastinal silhouette is mildly enlarged. The patient is status post median sternotomy. A stent graft is noted along the descending thoracic aorta. No acute osseous abnormalities are seen. Clips are noted overlying the left axilla.  IMPRESSION: Vascular congestion and mild cardiomegaly, with mildly increased interstitial markings, raising concern for mild interstitial edema.   Electronically Signed   By: Garald Balding M.D.   On: 12/09/2013 06:53     EKG Interpretation   Date/Time:  Thursday December 09 2013 07:41:40 EDT Ventricular Rate:  83 PR Interval:    QRS Duration: 94 QT Interval:  364 QTC Calculation: 427 R Axis:   61 Text Interpretation:  Atrial fibrillation Incomplete right bundle branch  block Anteroseptal infarct , age undetermined Abnormal ECG Confirmed by  DOCHERTY  MD, MEGAN ([id number]) on 12/09/2013 7:43:08 AM      MDM   Final diagnoses:  CHF (congestive heart failure)  Flash pulmonary edema  Confusion  UTI (lower urinary tract infection)    Pt is a 78 y.o. female with Pmhx as above who presents with sudden onset SOB, wheezing, dizziness, weakness that woke her from sleep at about 5:30 AM. Pt drove herself to the ED.  Denies CP, leg swelling, cough, sign contacts. On my exam, pt in afib with rate 110-140's, 92% on RA, hypertensive. Pulm exam with dec air mvmt and scattered wheezing throughout. EKG w/ afib, no prior for comparison. CXR w/ vascular congestion, mild cardiomegaly (mild interstitial edema). Trop nml, BNP [id number].  Suspect lung exam c/w cardiogenic wheezing. 1 dose 10mg  IV dilt given with improvement of HR, and 40mg  IV lasix will be given for CHF exacerbation, and/or flash pulm edema. Pt seems to have mild confusion, is oriented to person, place, but not time and cannot give an accurate medical hx. UA c/w UTI which may be cause of confusion. IV rocephin given. She is agreeable to admission to [facility]. Dr. Tamala Julian w/ cardiology  consulted & will accept to stepdown bed. Have been unable to contact her daughter, who was on her way to visit pt this morning from [place] and doesn't have cell phone.         Neta Ehlers, MD 12/09/13 1534

## 2013-12-09 NOTE — H&P (Signed)
Physician History and Physical     Patient ID: Beth [name] MRN: [id number] DOB/AGE: 23-Sep-1935 78 y.o. Admit date: 12/09/2013  Primary Care Physician: Imagene Riches, MD Primary Cardiologist:  Mare Ferrari  Active Problems:   CHF exacerbation  HPI: This pleasant 78 year old woman transferred [facility] [place] for CHF and afib.   She previously had had her cardiology followup at [facility] but established with Dr Mare Ferrari on 11/07/13 . She has a past history of having had surgery for an ascending and transverse thoracic aortic aneurysm treated with endograft in February 2006 at [facility]. In November 2009 she had surgery on a descending thoracic aortic aneurysm also at [facility]. In 2010 she underwent percutaneous repair of her endograft. She had coronary angiography in 2006 which showed smooth and normal coronary arteries.  She had a past history of mild but not severe hypertension. She was a remote smoker but not for 40 years. There is no family history of aneurysms.  The patient also has a history of atrial fibrillation. She has had 2 cardioversions, most recently in November 2014. Her initial cardioversion lasted only 2 months before she reverted to atrial fibrillation. She was then placed on flecainide and the second cardioversion in November 2014. She remained in normal sinus rhythm until earlier this month when she was noted to be back in atrial fibrillation. She previously was on Xarelto. However after her second cardioversion in November 2014 she went down to the beach feeling well. While at the beach she states that she went into heart failure and renal failure. She was told that her heart rate was extremely slow because of the medication. The dose of her flecainide was cut in half and the dose of her metoprolol was cut in half and she improved. She returned home and within several days had rectal bleeding and was followed by gastroenterology in [place]. She  was told that the bleeding was from an ulcerated area of the rectum which they thought was secondary to her previous heart failure and renal failure. Some time in that interval of time her Xarelto was stopped and has not been restarted. She has not had any subsequent evidence of GI bleeding. She has been on Protonix. She is also on [name] for arthritis.    The patient had an echocardiogram on 11/08/13 which showed an ejection fraction of 50-55% and there was moderate mitral regurgitation and mild pulmonary artery hypertension and there was left atrial enlargement.  During her last visit she was placed on renal dose xarelto with plans for [facility] next week.  She has been compliant with her meds.  Felt fine yesterday but awoke this am with palpitations and dyspnea  No chest pain CXR with mild CHF  BNP 7619  Improved with lasix.  Cr stable at 1.3   TSH suppressed 4/15  She does not think her dose was adjusted   Review of systems complete and found to be negative unless listed above   Past Medical History  Diagnosis Date  . Atrial fibrillation   . Hypertension   . Thyroid disease   . Aortic aneurysm with dissection   . Breast CA   . Acute renal failure (ARF)   . CHF (congestive heart failure)   . Aortic aneurysm, descending     History reviewed. No pertinent family history.  History   Social History  . Marital Status: Widowed    Spouse Name: N/A    Number of Children: N/A  .  Years of Education: N/A   Occupational History  . Not on file.   Social History Main Topics  . Smoking status: Former Research scientist (life sciences)  . Smokeless tobacco: Not on file  . Alcohol Use: No  . Drug Use: No  . Sexual Activity: Not on file   Other Topics Concern  . Not on file   Social History Narrative  . No narrative on file    Past Surgical History  Procedure Laterality Date  . Ascending aortic aneurysm repair    . Descending aortic aneurysm repair    . Breast lumpectomy    . Abdominal hysterectomy        Prescriptions prior to admission  Medication Sig Dispense Refill  . furosemide (LASIX) 20 MG tablet 1 TABLET ON Monday, Wednesday, AND Friday ONLY  20 tablet  3  . levothyroxine (SYNTHROID, LEVOTHROID) 75 MCG tablet Take 75 mcg by mouth daily before breakfast.      . losartan (COZAAR) 100 MG tablet Take 100 mg by mouth daily.      . metoprolol succinate (TOPROL-XL) 25 MG 24 hr tablet Take 25 mg by mouth daily.      . pantoprazole (PROTONIX) 40 MG tablet Take 40 mg by mouth daily.      . piroxicam ([name]) 20 MG capsule Take 20 mg by mouth as needed. To use sparingly      . Rivaroxaban (XARELTO) 15 MG TABS tablet Take 1 tablet (15 mg total) by mouth daily with supper.  30 tablet  5  . acetaminophen (TYLENOL) 500 MG tablet Take 500 mg by mouth as needed.      . cetirizine (ZYRTEC) 10 MG tablet Take 10 mg by mouth as needed.      . flecainide (TAMBOCOR) 50 MG tablet Take 50 mg by mouth 2 (two) times daily.      . fluticasone (FLONASE) 50 MCG/ACT nasal spray Place 1 spray into both nostrils daily as needed.      Marland Kitchen HYDROcodone-acetaminophen (HYCET) 7.5-325 mg/15 ml solution Take 10 mLs by mouth every 6 (six) hours as needed (sore throat).  120 mL  0  . loratadine (CLARITIN) 10 MG tablet Take 1 tablet (10 mg total) by mouth daily.  30 tablet  0  . Multiple Vitamins-Iron (MULTIVITAMIN/IRON PO) Take by mouth daily.      . predniSONE (DELTASONE) 10 MG tablet Take 10 mg by mouth as needed.        Physical Exam: Blood pressure 173/76, pulse 78, temperature 97.7 F (36.5 C), temperature source Oral, resp. rate 20, SpO2 97.00%.   Affect appropriate Healthy:  appears stated age 78: normal Neck supple with no adenopathy JVP normal no bruits no thyromegaly Lungs basilar rales no wheezing and good diaphragmatic motion Heart:  S1/S2 SEM through AVR  murmur, no rub, gallop or click PMI normal Abdomen: benighn, BS positve, no tenderness, no AAA no bruit.  No HSM or HJR Distal pulses intact with  no bruits No edema Neuro non-focal Skin warm and dry No muscular weakness  No current facility-administered medications on file prior to encounter.   Current Outpatient Prescriptions on File Prior to Encounter  Medication Sig Dispense Refill  . furosemide (LASIX) 20 MG tablet 1 TABLET ON Monday, Wednesday, AND Friday ONLY  20 tablet  3  . levothyroxine (SYNTHROID, LEVOTHROID) 75 MCG tablet Take 75 mcg by mouth daily before breakfast.      . losartan (COZAAR) 100 MG tablet Take 100 mg by mouth daily.      Marland Kitchen  metoprolol succinate (TOPROL-XL) 25 MG 24 hr tablet Take 25 mg by mouth daily.      . pantoprazole (PROTONIX) 40 MG tablet Take 40 mg by mouth daily.      . piroxicam ([name]) 20 MG capsule Take 20 mg by mouth as needed. To use sparingly      . Rivaroxaban (XARELTO) 15 MG TABS tablet Take 1 tablet (15 mg total) by mouth daily with supper.  30 tablet  5  . acetaminophen (TYLENOL) 500 MG tablet Take 500 mg by mouth as needed.      . cetirizine (ZYRTEC) 10 MG tablet Take 10 mg by mouth as needed.      . flecainide (TAMBOCOR) 50 MG tablet Take 50 mg by mouth 2 (two) times daily.      . fluticasone (FLONASE) 50 MCG/ACT nasal spray Place 1 spray into both nostrils daily as needed.      Marland Kitchen HYDROcodone-acetaminophen (HYCET) 7.5-325 mg/15 ml solution Take 10 mLs by mouth every 6 (six) hours as needed (sore throat).  120 mL  0  . loratadine (CLARITIN) 10 MG tablet Take 1 tablet (10 mg total) by mouth daily.  30 tablet  0  . Multiple Vitamins-Iron (MULTIVITAMIN/IRON PO) Take by mouth daily.      . predniSONE (DELTASONE) 10 MG tablet Take 10 mg by mouth as needed.        Labs:   Lab Results  Component Value Date   WBC 5.3 12/09/2013   HGB 11.4* 12/09/2013   HCT 34.3* 12/09/2013   MCV 96.6 12/09/2013   PLT 203 12/09/2013    Recent Labs Lab 12/09/13 0655  NA 139  K 4.5  CL 104  CO2 23  BUN 26*  CREATININE 1.30*  CALCIUM 9.5  GLUCOSE 127*   Lab Results  Component Value Date    TROPONINI <0.30 12/09/2013     Lab Results  Component Value Date   CHOL 225* 10/20/2013   Lab Results  Component Value Date   HDL 117.00 10/20/2013   Lab Results  Component Value Date   LDLCALC 97 10/20/2013   Lab Results  Component Value Date   TRIG 57.0 10/20/2013   Lab Results  Component Value Date   CHOLHDL 2 10/20/2013   No results found for this basename: LDLDIRECT       Radiology: Dg Chest Portable 1 View  12/09/2013   CLINICAL DATA:  Difficulty breathing and wheezing.  EXAM: PORTABLE CHEST - 1 VIEW  COMPARISON:  Chest radiograph performed 10/20/2013  FINDINGS: The lungs are well-aerated. Vascular congestion is noted, with mildly increased interstitial markings, raising concern for mild interstitial edema. There is no evidence of pleural effusion or pneumothorax.  The cardiomediastinal silhouette is mildly enlarged. The patient is status post median sternotomy. A stent graft is noted along the descending thoracic aorta. No acute osseous abnormalities are seen. Clips are noted overlying the left axilla.  IMPRESSION: Vascular congestion and mild cardiomegaly, with mildly increased interstitial markings, raising concern for mild interstitial edema.   Electronically Signed   By: Garald Balding M.D.   On: 12/09/2013 06:53    EKG:  afib rate 83  ICRBBB no acute ischemic changes   ASSESSMENT AND PLAN:   Afib:  Will arrange cardioversion at bedside in am  She has been on xarelto  Continue current dose of flecainide and metoprolol  Risks discussed willing to proceed has had twice before.  May need EP consult if recurrs again.  Recheck TSH and consider  lowering synthroid dose to 50ug CHF:  Lasix 40 mg daily  Hopefully restoring AV synchrony will help  Combination of diastolic dysfunction, MR and afib.  With poor BP control HTN  Add hydralazine tid and follow Thyroid:  Check TSH suppressed in April  Has been replaced for over 35 years  Signed: Collier Salina Nishan6/18/2015, 12:41 PM

## 2013-12-10 ENCOUNTER — Encounter (HOSPITAL_COMMUNITY)
Admission: EM | Disposition: A | Discharge status: Home / Self Care | Payer: Self-pay | Source: Home / Self Care | Attending: Cardiovascular Disease

## 2013-12-10 ENCOUNTER — Encounter (HOSPITAL_COMMUNITY): Payer: Self-pay | Admitting: Certified Registered Nurse Anesthetist

## 2013-12-10 ENCOUNTER — Encounter (HOSPITAL_COMMUNITY): Payer: Medicare Other | Admitting: Certified Registered Nurse Anesthetist

## 2013-12-10 ENCOUNTER — Observation Stay (HOSPITAL_COMMUNITY): Payer: Medicare Other | Admitting: Certified Registered Nurse Anesthetist

## 2013-12-10 DIAGNOSIS — I4891 Unspecified atrial fibrillation: Secondary | ICD-10-CM

## 2013-12-10 DIAGNOSIS — N390 Urinary tract infection, site not specified: Secondary | ICD-10-CM | POA: Diagnosis not present

## 2013-12-10 DIAGNOSIS — I712 Thoracic aortic aneurysm, without rupture, unspecified: Secondary | ICD-10-CM | POA: Diagnosis not present

## 2013-12-10 DIAGNOSIS — I5031 Acute diastolic (congestive) heart failure: Secondary | ICD-10-CM

## 2013-12-10 DIAGNOSIS — I5033 Acute on chronic diastolic (congestive) heart failure: Secondary | ICD-10-CM | POA: Diagnosis not present

## 2013-12-10 HISTORY — PX: CARDIOVERSION: SHX1299

## 2013-12-10 LAB — BASIC METABOLIC PANEL
BUN: 28 mg/dL — ABNORMAL HIGH (ref 6–23)
CO2: 26 mEq/L (ref 19–32)
Calcium: 9.2 mg/dL (ref 8.4–10.5)
Chloride: 96 mEq/L (ref 96–112)
Creatinine, Ser: 1.48 mg/dL — ABNORMAL HIGH (ref 0.50–1.10)
GFR, EST AFRICAN AMERICAN: 38 mL/min — AB (ref >90)
GFR, EST NON AFRICAN AMERICAN: 33 mL/min — AB (ref >90)
Glucose, Bld: 98 mg/dL (ref 70–99)
POTASSIUM: 4.3 meq/L (ref 3.7–5.3)
Sodium: 135 mEq/L — ABNORMAL LOW (ref 137–147)

## 2013-12-10 LAB — MAGNESIUM: Magnesium: 2 mg/dL (ref 1.5–2.5)

## 2013-12-10 LAB — TROPONIN I

## 2013-12-10 SURGERY — CARDIOVERSION
Anesthesia: Monitor Anesthesia Care

## 2013-12-10 MED ORDER — SODIUM CHLORIDE 0.9 % IJ SOLN: 3.0000 mL | INTRAMUSCULAR | Status: DC | PRN

## 2013-12-10 MED ORDER — FUROSEMIDE 20 MG PO TABS
20.0000 mg | ORAL_TABLET | Freq: Every day | ORAL | Status: DC
  Administered 2013-12-10 – 2013-12-11 (×2): 20 mg via ORAL
  Filled 2013-12-10 (×2): qty 1

## 2013-12-10 MED ORDER — SODIUM CHLORIDE 0.9 % IV SOLN: 250.0000 mL | INTRAVENOUS | Status: DC

## 2013-12-10 MED ORDER — LIDOCAINE HCL (CARDIAC) 20 MG/ML IV SOLN
INTRAVENOUS | Status: DC | PRN
  Administered 2013-12-10: 80 mg via INTRAVENOUS

## 2013-12-10 MED ORDER — PROPOFOL 10 MG/ML IV BOLUS
INTRAVENOUS | Status: DC | PRN
  Administered 2013-12-10: 60 mg via INTRAVENOUS

## 2013-12-10 MED ORDER — HYDROCORTISONE 1 % EX CREA
1.0000 "application " | TOPICAL_CREAM | Freq: Three times a day (TID) | CUTANEOUS | Status: DC | PRN
  Filled 2013-12-10: qty 28

## 2013-12-10 MED ORDER — SODIUM CHLORIDE 0.9 % IJ SOLN
3.0000 mL | Freq: Two times a day (BID) | INTRAMUSCULAR | Status: DC
  Administered 2013-12-10: 3 mL via INTRAVENOUS

## 2013-12-10 MED ORDER — ACETAMINOPHEN 325 MG PO TABS
650.0000 mg | ORAL_TABLET | Freq: Four times a day (QID) | ORAL | Status: DC | PRN
  Administered 2013-12-10 – 2013-12-11 (×2): 650 mg via ORAL
  Filled 2013-12-10 (×2): qty 2

## 2013-12-10 NOTE — Progress Notes (Signed)
TELEMETRY: Reviewed telemetry pt in atrial fibrillation with rate 80-90.: Filed Vitals:   12/09/13 2355 12/10/13 0358 12/10/13 0652 12/10/13 0801  BP: 120/39 155/66 164/71 143/42  Pulse: 88 82  82  Temp: 97.9 F (36.6 C) 98 F (36.7 C)  97.8 F (36.6 C)  TempSrc: Oral Oral  Oral  Resp:      Weight:      SpO2: 98% 95%  95%    Intake/Output Summary (Last 24 hours) at 12/10/13 0902 Last data filed at 12/09/13 2101  Gross per 24 hour  Intake    660 ml  Output    600 ml  Net     60 ml   Filed Weights   12/09/13 1300  Weight: 151 lb 4.8 oz (68.629 kg)    Subjective Feels well. Denies chest pain or SOB.   . cefTRIAXone (ROCEPHIN)  IV  1 g Intravenous Q24H  . flecainide  100 mg Oral BID  . furosemide  40 mg Intravenous Daily  . hydrALAZINE  25 mg Oral 3 times per day  . levothyroxine  75 mcg Oral QAC breakfast  . losartan  100 mg Oral Daily  . metoprolol succinate  25 mg Oral Daily  . multivitamins with iron  1 tablet Oral Daily  . pantoprazole  40 mg Oral Daily  . Rivaroxaban  15 mg Oral Q supper  . sodium chloride  3 mL Intravenous Q12H  . sodium chloride  3 mL Intravenous Q12H   . sodium chloride      LABS: Basic Metabolic Panel:  Recent Labs  12/09/13 0655 12/09/13 1438 12/10/13 0138  NA 139  --  135*  K 4.5  --  4.3  CL 104  --  96  CO2 23  --  26  GLUCOSE 127*  --  98  BUN 26*  --  28*  CREATININE 1.30*  --  1.48*  CALCIUM 9.5  --  9.2  MG  --  1.5 2.0   Liver Function Tests: No results found for this basename: AST, ALT, ALKPHOS, BILITOT, PROT, ALBUMIN,  in the last 72 hours No results found for this basename: LIPASE, AMYLASE,  in the last 72 hours CBC:  Recent Labs  12/09/13 0655  WBC 5.3  NEUTROABS 3.4  HGB 11.4*  HCT 34.3*  MCV 96.6  PLT 203   Cardiac Enzymes:  Recent Labs  12/09/13 1315 12/09/13 1915 12/10/13 0138  TROPONINI <0.30 <0.30 <0.30   BNP:  Recent Labs  12/09/13 0655  PROBNP 7619.0*   D-Dimer: No results  found for this basename: DDIMER,  in the last 72 hours Hemoglobin A1C: No results found for this basename: HGBA1C,  in the last 72 hours Fasting Lipid Panel: No results found for this basename: CHOL, HDL, LDLCALC, TRIG, CHOLHDL, LDLDIRECT,  in the last 72 hours Thyroid Function Tests:  Recent Labs  12/09/13 1438  TSH 1.150     Radiology/Studies:  Dg Chest Portable 1 View  12/09/2013   CLINICAL DATA:  Difficulty breathing and wheezing.  EXAM: PORTABLE CHEST - 1 VIEW  COMPARISON:  Chest radiograph performed 10/20/2013  FINDINGS: The lungs are well-aerated. Vascular congestion is noted, with mildly increased interstitial markings, raising concern for mild interstitial edema. There is no evidence of pleural effusion or pneumothorax.  The cardiomediastinal silhouette is mildly enlarged. The patient is status post median sternotomy. A stent graft is noted along the descending thoracic aorta. No acute osseous abnormalities are seen. Clips are noted overlying  the left axilla.  IMPRESSION: Vascular congestion and mild cardiomegaly, with mildly increased interstitial markings, raising concern for mild interstitial edema.   Electronically Signed   By: Garald Balding M.D.   On: 12/09/2013 06:53   Echo: 11/08/13: Study Conclusions  - Left ventricle: Inferobasal hypokinesis. The cavity size was normal. Wall thickness was normal. Systolic function was normal. The estimated ejection fraction was in the range of 50% to 55%. - Mitral valve: Type 3 dysfunction with restricted posterior leaflet motion. Calcified annulus. There was moderate regurgitation. - Left atrium: The atrium was mildly to moderately dilated. - Atrial septum: No defect or patent foramen ovale was identified. - Pulmonary arteries: PA peak pressure: 51 mm Hg (S). - Pericardium, extracardiac: A trivial pericardial effusion was identified posterior to the heart. - Impressions: Elevated systolic velocity in the suprasternal  notch suggesting coarctation or some flow acceleration near the left subclavian take off  Impressions:  - Elevated systolic velocity in the suprasternal notch suggesting coarctation or some flow acceleration near the left subclavian take off   PHYSICAL EXAM General: Well developed, well nourished, in no acute distress. Head: Normocephalic, atraumatic, sclera non-icteric, oropharynx is clear Neck: Negative for carotid bruits. JVD not elevated. No adenopathy Lungs: Clear bilaterally to auscultation without wheezes, rales, or rhonchi. Breathing is unlabored. Heart: RRR S1 S2 without murmurs, rubs, or gallops.  Abdomen: Soft, non-tender, non-distended with normoactive bowel sounds. No hepatomegaly. No rebound/guarding. No obvious abdominal masses. Msk:  Strength and tone appears normal for age. Extremities: No clubbing, cyanosis or edema.  Distal pedal pulses are 2+ and equal bilaterally. Neuro: Alert and oriented X 3. Moves all extremities spontaneously. Psych:  Responds to questions appropriately with a normal affect.  ASSESSMENT AND PLAN: 1. Recurrent atrial fibrillation. On Xarelto. On Flecainide 100 mg bid. Has been in NSR for 7 months on Flecainide. Plan DC cardioversion today. If recurrent Afib may need EP consult to consider alternative AAD versus ablation.  2. Acute on chronic diastolic CHF. Good diuresis. Will switch Lasix to po.   3. MR.   4. HTN- improved control on hydralazine.   5. CKD stage 3.   Present on Admission:  . CHF exacerbation . CHF (congestive heart failure)  Signed, Peter Martinique, [place] 12/10/2013 9:02 AM

## 2013-12-10 NOTE — Progress Notes (Signed)
Patients heart rate 78 NSR

## 2013-12-10 NOTE — Progress Notes (Signed)
Pt now back in NSR ,HR-80's "MD  Was called

## 2013-12-10 NOTE — Anesthesia Preprocedure Evaluation (Signed)
Anesthesia Evaluation  Patient identified by MRN, date of birth, ID band Patient awake    Reviewed: Allergy & Precautions, H&P , NPO status , Patient's Chart, lab work & pertinent test results, reviewed documented beta blocker date and time   History of Anesthesia Complications Negative for: history of anesthetic complications  Airway Mallampati: II TM Distance: >3 FB Neck ROM: Full    Dental  (+) Teeth Intact, Dental Advisory Given   Pulmonary shortness of breath and with exertion, former smoker,    Pulmonary exam normal       Cardiovascular hypertension, Pt. on medications and Pt. on home beta blockers +CHF and + DOE Rhythm:Irregular Rate:Normal  S/p aortic aneurysm repair x2 2006, 2010 at [facility]   Neuro/Psych negative neurological ROS  negative psych ROS   GI/Hepatic negative GI ROS,   Endo/Other  negative endocrine ROS  Renal/GU negative Renal ROS     Musculoskeletal   Abdominal   Peds  Hematology   Anesthesia Other Findings   Reproductive/Obstetrics                           Anesthesia Physical Anesthesia Plan  ASA: III  Anesthesia Plan: MAC   Post-op Pain Management:    Induction: Intravenous  Airway Management Planned: Mask  Additional Equipment:   Intra-op Plan:   Post-operative Plan:   Informed Consent: I have reviewed the patients History and Physical, chart, labs and discussed the procedure including the risks, benefits and alternatives for the proposed anesthesia with the patient or authorized representative who has indicated his/her understanding and acceptance.   Dental advisory given  Plan Discussed with: Anesthesiologist and Surgeon  Anesthesia Plan Comments:         Anesthesia Quick Evaluation

## 2013-12-10 NOTE — H&P (View-Only) (Signed)
TELEMETRY: Reviewed telemetry pt in atrial fibrillation with rate 80-90.: Filed Vitals:   12/09/13 2355 12/10/13 0358 12/10/13 0652 12/10/13 0801  BP: 120/39 155/66 164/71 143/42  Pulse: 88 82  82  Temp: 97.9 F (36.6 C) 98 F (36.7 C)  97.8 F (36.6 C)  TempSrc: Oral Oral  Oral  Resp:      Weight:      SpO2: 98% 95%  95%    Intake/Output Summary (Last 24 hours) at 12/10/13 0902 Last data filed at 12/09/13 2101  Gross per 24 hour  Intake    660 ml  Output    600 ml  Net     60 ml   Filed Weights   12/09/13 1300  Weight: 151 lb 4.8 oz (68.629 kg)    Subjective Feels well. Denies chest pain or SOB.   . cefTRIAXone (ROCEPHIN)  IV  1 g Intravenous Q24H  . flecainide  100 mg Oral BID  . furosemide  40 mg Intravenous Daily  . hydrALAZINE  25 mg Oral 3 times per day  . levothyroxine  75 mcg Oral QAC breakfast  . losartan  100 mg Oral Daily  . metoprolol succinate  25 mg Oral Daily  . multivitamins with iron  1 tablet Oral Daily  . pantoprazole  40 mg Oral Daily  . Rivaroxaban  15 mg Oral Q supper  . sodium chloride  3 mL Intravenous Q12H  . sodium chloride  3 mL Intravenous Q12H   . sodium chloride      LABS: Basic Metabolic Panel:  Recent Labs  12/09/13 0655 12/09/13 1438 12/10/13 0138  NA 139  --  135*  K 4.5  --  4.3  CL 104  --  96  CO2 23  --  26  GLUCOSE 127*  --  98  BUN 26*  --  28*  CREATININE 1.30*  --  1.48*  CALCIUM 9.5  --  9.2  MG  --  1.5 2.0   Liver Function Tests: No results found for this basename: AST, ALT, ALKPHOS, BILITOT, PROT, ALBUMIN,  in the last 72 hours No results found for this basename: LIPASE, AMYLASE,  in the last 72 hours CBC:  Recent Labs  12/09/13 0655  WBC 5.3  NEUTROABS 3.4  HGB 11.4*  HCT 34.3*  MCV 96.6  PLT 203   Cardiac Enzymes:  Recent Labs  12/09/13 1315 12/09/13 1915 12/10/13 0138  TROPONINI <0.30 <0.30 <0.30   BNP:  Recent Labs  12/09/13 0655  PROBNP 7619.0*   D-Dimer: No results  found for this basename: DDIMER,  in the last 72 hours Hemoglobin A1C: No results found for this basename: HGBA1C,  in the last 72 hours Fasting Lipid Panel: No results found for this basename: CHOL, HDL, LDLCALC, TRIG, CHOLHDL, LDLDIRECT,  in the last 72 hours Thyroid Function Tests:  Recent Labs  12/09/13 1438  TSH 1.150     Radiology/Studies:  Dg Chest Portable 1 View  12/09/2013   CLINICAL DATA:  Difficulty breathing and wheezing.  EXAM: PORTABLE CHEST - 1 VIEW  COMPARISON:  Chest radiograph performed 10/20/2013  FINDINGS: The lungs are well-aerated. Vascular congestion is noted, with mildly increased interstitial markings, raising concern for mild interstitial edema. There is no evidence of pleural effusion or pneumothorax.  The cardiomediastinal silhouette is mildly enlarged. The patient is status post median sternotomy. A stent graft is noted along the descending thoracic aorta. No acute osseous abnormalities are seen. Clips are noted overlying  the left axilla.  IMPRESSION: Vascular congestion and mild cardiomegaly, with mildly increased interstitial markings, raising concern for mild interstitial edema.   Electronically Signed   By: Garald Balding M.D.   On: 12/09/2013 06:53   Echo: 11/08/13: Study Conclusions  - Left ventricle: Inferobasal hypokinesis. The cavity size was normal. Wall thickness was normal. Systolic function was normal. The estimated ejection fraction was in the range of 50% to 55%. - Mitral valve: Type 3 dysfunction with restricted posterior leaflet motion. Calcified annulus. There was moderate regurgitation. - Left atrium: The atrium was mildly to moderately dilated. - Atrial septum: No defect or patent foramen ovale was identified. - Pulmonary arteries: PA peak pressure: 51 mm Hg (S). - Pericardium, extracardiac: A trivial pericardial effusion was identified posterior to the heart. - Impressions: Elevated systolic velocity in the suprasternal  notch suggesting coarctation or some flow acceleration near the left subclavian take off  Impressions:  - Elevated systolic velocity in the suprasternal notch suggesting coarctation or some flow acceleration near the left subclavian take off   PHYSICAL EXAM General: Well developed, well nourished, in no acute distress. Head: Normocephalic, atraumatic, sclera non-icteric, oropharynx is clear Neck: Negative for carotid bruits. JVD not elevated. No adenopathy Lungs: Clear bilaterally to auscultation without wheezes, rales, or rhonchi. Breathing is unlabored. Heart: RRR S1 S2 without murmurs, rubs, or gallops.  Abdomen: Soft, non-tender, non-distended with normoactive bowel sounds. No hepatomegaly. No rebound/guarding. No obvious abdominal masses. Msk:  Strength and tone appears normal for age. Extremities: No clubbing, cyanosis or edema.  Distal pedal pulses are 2+ and equal bilaterally. Neuro: Alert and oriented X 3. Moves all extremities spontaneously. Psych:  Responds to questions appropriately with a normal affect.  ASSESSMENT AND PLAN: 1. Recurrent atrial fibrillation. On Xarelto. On Flecainide 100 mg bid. Has been in NSR for 7 months on Flecainide. Plan DC cardioversion today. If recurrent Afib may need EP consult to consider alternative AAD versus ablation.  2. Acute on chronic diastolic CHF. Good diuresis. Will switch Lasix to po.   3. MR.   4. HTN- improved control on hydralazine.   5. CKD stage 3.   Present on Admission:  . CHF exacerbation . CHF (congestive heart failure)  Signed, Peter Martinique, [place] 12/10/2013 9:02 AM

## 2013-12-10 NOTE — Progress Notes (Signed)
Post EKG  Noted patient back in Atrial Fib Dr. Martinique notified.

## 2013-12-10 NOTE — Interval H&P Note (Signed)
History and Physical Interval Note:  12/10/2013 11:22 AM  Beth [name]  has presented today for surgery, with the diagnosis of a fib   The various methods of treatment have been discussed with the patient and family. After consideration of risks, benefits and other options for treatment, the patient has consented to  Procedure(s): CARDIOVERSION   (bedside) (N/A) as a surgical intervention .  The patient's history has been reviewed, patient examined, no change in status, stable for surgery.  I have reviewed the patient's chart and labs.  Questions were answered to the patient's satisfaction.     Collier Salina [facility] 12/10/2013 11:22 AM

## 2013-12-10 NOTE — Transfer of Care (Signed)
Immediate Anesthesia Transfer of Care Note  Patient: Beth [name]  Procedure(s) Performed: Procedure(s): CARDIOVERSION   (bedside) (N/A)  Patient Location: ICU 2 H16  Anesthesia Type:MAC  Level of Consciousness: awake, alert  and oriented  Airway & Oxygen Therapy: Patient Spontanous Breathing  Post-op Assessment: Report given to PACU RN, Post -op Vital signs reviewed and stable and Patient moving all extremities  Post vital signs: Reviewed and stable  Complications: No apparent anesthesia complications

## 2013-12-10 NOTE — Progress Notes (Signed)
UR Completed Brenda Graves-Bigelow, RN,BSN [phone]  

## 2013-12-10 NOTE — Anesthesia Postprocedure Evaluation (Signed)
  Anesthesia Post-op Note  Patient: Beth [name]  Procedure(s) Performed: Procedure(s): CARDIOVERSION   (bedside) (N/A)  Patient Location: ICU  Anesthesia Type:MAC  Level of Consciousness: awake, alert  and oriented  Airway and Oxygen Therapy: Patient Spontanous Breathing  Post-op Pain: none  Post-op Assessment: Post-op Vital signs reviewed, Patient's Cardiovascular Status Stable, Respiratory Function Stable, Patent Airway and No signs of Nausea or vomiting  Post-op Vital Signs: Reviewed and stable  Last Vitals:  Filed Vitals:   12/10/13 0801  BP: 143/42  Pulse: 82  Temp: 36.6 C  Resp:     Complications: No apparent anesthesia complications

## 2013-12-10 NOTE — Progress Notes (Signed)
Was asked by CM if patient is being discharged today. Clarified with Wannetta Sender - she said Dr. Martinique originally planned to let patient go but since she went back into AF post-cardioversion, Dr. Martinique wanted to keep her for observation today. EP will see tomorrow. Dayna Dunn PA-C

## 2013-12-10 NOTE — CV Procedure (Addendum)
    Electrical Cardioversion Procedure Note Beth [name] [id number] [birth year]  Procedure: Electrical Cardioversion Indications:  Atrial Fibrillation  Procedure Details Consent: Risks of procedure as well as the alternatives and risks of each were explained to the (patient/caregiver).  Consent for procedure obtained. Time Out: Verified patient identification, verified procedure, site/side was marked, verified correct patient position, special equipment/implants available, medications/allergies/relevent history reviewed, required imaging and test results available.  Performed  Patient placed on cardiac monitor, pulse oximetry, supplemental oxygen as necessary.  Sedation given: Short-acting barbiturates Pacer pads placed anterior and posterior chest.  Cardioverted 1 time(s).  Cardioverted at 120J.  Evaluation Findings: Post procedure EKG shows: NSR Complications: None Patient did tolerate procedure well.  Plan: DC home later today when patient ambulatory.   Peter Martinique, [place] 12/10/2013, 11:33 AM

## 2013-12-11 ENCOUNTER — Encounter (HOSPITAL_COMMUNITY): Payer: Self-pay | Admitting: Cardiology

## 2013-12-11 DIAGNOSIS — I5033 Acute on chronic diastolic (congestive) heart failure: Secondary | ICD-10-CM | POA: Diagnosis present

## 2013-12-11 DIAGNOSIS — N390 Urinary tract infection, site not specified: Secondary | ICD-10-CM

## 2013-12-11 DIAGNOSIS — I059 Rheumatic mitral valve disease, unspecified: Secondary | ICD-10-CM

## 2013-12-11 DIAGNOSIS — Z7901 Long term (current) use of anticoagulants: Secondary | ICD-10-CM

## 2013-12-11 DIAGNOSIS — I5031 Acute diastolic (congestive) heart failure: Secondary | ICD-10-CM | POA: Diagnosis present

## 2013-12-11 HISTORY — DX: Acute diastolic (congestive) heart failure: I50.31

## 2013-12-11 HISTORY — DX: Hypomagnesemia: E83.42

## 2013-12-11 HISTORY — DX: Urinary tract infection, site not specified: N39.0

## 2013-12-11 LAB — BASIC METABOLIC PANEL
BUN: 29 mg/dL — ABNORMAL HIGH (ref 6–23)
CO2: 24 mEq/L (ref 19–32)
Calcium: 8.9 mg/dL (ref 8.4–10.5)
Chloride: 96 mEq/L (ref 96–112)
Creatinine, Ser: 1.32 mg/dL — ABNORMAL HIGH (ref 0.50–1.10)
GFR, EST AFRICAN AMERICAN: 44 mL/min — AB (ref >90)
GFR, EST NON AFRICAN AMERICAN: 38 mL/min — AB (ref >90)
Glucose, Bld: 96 mg/dL (ref 70–99)
POTASSIUM: 4.3 meq/L (ref 3.7–5.3)
SODIUM: 136 meq/L — AB (ref 137–147)

## 2013-12-11 LAB — URINE CULTURE: Colony Count: >=100000

## 2013-12-11 MED ORDER — CEPHALEXIN 250 MG PO CAPS
250.0000 mg | ORAL_CAPSULE | Freq: Three times a day (TID) | ORAL | Status: DC
  Administered 2013-12-11: 250 mg via ORAL
  Filled 2013-12-11 (×4): qty 1

## 2013-12-11 MED ORDER — CEPHALEXIN 250 MG PO CAPS: 250.0000 mg | ORAL_CAPSULE | Freq: Three times a day (TID) | ORAL | Status: DC

## 2013-12-11 MED ORDER — FUROSEMIDE 20 MG PO TABS: 20.0000 mg | ORAL_TABLET | Freq: Every day | ORAL | Status: DC

## 2013-12-11 MED ORDER — HYDROCORTISONE 1 % EX CREA: 1.0000 "application " | TOPICAL_CREAM | Freq: Three times a day (TID) | CUTANEOUS | Status: DC | PRN

## 2013-12-11 MED ORDER — HYDRALAZINE HCL 25 MG PO TABS: 25.0000 mg | ORAL_TABLET | Freq: Three times a day (TID) | ORAL | Status: DC

## 2013-12-11 NOTE — Discharge Summary (Signed)
Physician Discharge Summary       Patient ID: Beth [name] MRN: [id number] DOB/AGE: 02/03/1936 78 y.o.  Admit date: 12/09/2013 Discharge date: 12/11/2013  Discharge Diagnoses:  Principal Problem:   Acute diastolic heart failure, in combination with MR and atrial fib Active Problems:   Atrial fibrillation, 12/10/13 DCCV maintaining SR    Thoracic ascending aortic aneurysm   Descending thoracic aortic aneurysm   Hypothyroid   Chronic diastolic heart failure   On continuous oral anticoagulation, Xarelto   UTI (urinary tract infection)   Discharged Condition: good  Procedures: DCCV 12/10/13 by Dr. Martinique  Hospital Course: 78 year old woman transferred [facility] [place] for CHF and afib. She previously had had her cardiology followup at [facility] but established with Dr Mare Ferrari on 11/07/13 . She has a past history of having had surgery for an ascending and transverse thoracic aortic aneurysm treated with endograft in February 2006 at [facility]. In November 2009 she had surgery on a descending thoracic aortic aneurysm also at [facility]. In 2010 she underwent percutaneous repair of her endograft. She had coronary angiography in 2006 which showed smooth and normal coronary arteries.  She had a past history of mild but not severe hypertension. She was a remote smoker but not for 40 years. There is no family history of aneurysms.   The patient also has a history of atrial fibrillation. She has had 2 cardioversions, most recently in November 2014. Her initial cardioversion lasted only 2 months before she reverted to atrial fibrillation. She was then placed on flecainide and the second cardioversion in November 2014. She remained in normal sinus rhythm until earlier this month when she was noted to be back in atrial fibrillation. She previously was on Xarelto. However after her second cardioversion in November 2014 she went down to the beach feeling well. While at the  beach she states that she went into heart failure and renal failure. She was told that her heart rate was extremely slow because of the medication. The dose of her flecainide was cut in half and the dose of her metoprolol was cut in half and she improved. She returned home and within several days had rectal bleeding and was followed by gastroenterology in [place]. She was told that the bleeding was from an ulcerated area of the rectum which they thought was secondary to her previous heart failure and renal failure. Some time in that interval of time her Xarelto was stopped and has not been restarted. She has not had any subsequent evidence of GI bleeding. She has been on Protonix. She is also on [name] for arthritis.    The patient had an echocardiogram on 11/08/13 which showed an ejection fraction of 50-55% and there was moderate mitral regurgitation and mild pulmonary artery hypertension and there was left atrial enlargement.   During her last visit she was placed on renal dose xarelto with plans for [facility] next week. She was also on 100 mg flecainide BID and continues on this dose. She has been compliant with her meds. Felt fine 12/08/13 but awoke this am (12/09/13) with palpitations and dyspnea No chest pain.   CXR with mild CHF BNP 7619 Improved with lasix. Cr stable at 1.3 TSH suppressed 4/15 She does not think her dose was adjusted   12/10/13 -pt remained in a fib rate 80-90, and underwent DCCV X1 under sedation at 120 J.  She was cardioverted to SR but later in the day went back into A  fib.  She was kept overnight but during late afternoon spontaneously converted to SR and has maintained  SR overnight. She has been seen and examined by Dr. Martinique and found stable for discharge with Flecainide and metoprolol and Xarelto.  She will follow up with Dr. Mare Ferrari on July 1st.  She will call if problems prior to that time.   + UTI rec'd IV rocephin in hospital and discharged on 5 days of Keflex.  Also  decreased Magnesium and repleated in hospital.  ECHO Prior to ADMIT: - Left ventricle: Inferobasal hypokinesis. The cavity size was normal. Wall thickness was normal. Systolic function was normal. The estimated ejection fraction was in the range of 50% to 55%. - Mitral valve: Type 3 dysfunction with restricted posterior leaflet motion. Calcified annulus. There was moderate regurgitation. - Left atrium: The atrium was mildly to moderately dilated. - Atrial septum: No defect or patent foramen ovale was identified. - Pulmonary arteries: PA peak pressure: 51 mm Hg (S). - Pericardium, extracardiac: A trivial pericardial effusion was identified posterior to the heart. - Impressions: Elevated systolic velocity in the suprasternal notch suggesting coarctation or some flow acceleration near the left subclavian take off Impressions: - Elevated systolic velocity in the suprasternal notch suggesting coarctation or some flow acceleration near the left subclavian take off    Consults: None  Significant Diagnostic Studies:  BMET    Component Value Date/Time   NA 136* 12/11/2013 0248   K 4.3 12/11/2013 0248   CL 96 12/11/2013 0248   CO2 24 12/11/2013 0248   GLUCOSE 96 12/11/2013 0248   BUN 29* 12/11/2013 0248   CREATININE 1.32* 12/11/2013 0248   CALCIUM 8.9 12/11/2013 0248   GFRNONAA 38* 12/11/2013 0248   GFRAA 44* 12/11/2013 0248    CBC    Component Value Date/Time   WBC 5.3 12/09/2013 0655   RBC 3.55* 12/09/2013 0655   HGB 11.4* 12/09/2013 0655   HCT 34.3* 12/09/2013 0655   PLT 203 12/09/2013 0655   MCV 96.6 12/09/2013 0655   MCH 32.1 12/09/2013 0655   MCHC 33.2 12/09/2013 0655   RDW 16.0* 12/09/2013 0655   LYMPHSABS 1.0 12/09/2013 0655   MONOABS 0.7 12/09/2013 0655   EOSABS 0.2 12/09/2013 0655   BASOSABS 0.0 12/09/2013 0655     Troponin <0.30 X 3.  BNP (last 3 results)  Recent Labs  12/09/13 0655  PROBNP 7619.0*   TSH 1.150, free T4 1.63 Mg 1.5 follow up 2.0  U/A + nitrites, sm.  Leukocytes, mod. Hgb.   Discharge Exam: Blood pressure 114/42, pulse 76, temperature 98.4 F (36.9 C), temperature source Oral, resp. rate 18, height 5\' 5"  (1.651 m), weight 152 lb 9.6 oz (69.219 kg), SpO2 98.00%.  Disposition: 01-Home or Self Care     Medication List         cephALEXin 250 MG capsule  Commonly known as:  KEFLEX  Take 1 capsule (250 mg total) by mouth every 8 (eight) hours.     flecainide 100 MG tablet  Commonly known as:  TAMBOCOR  Take 100 mg by mouth 2 (two) times daily.     furosemide 20 MG tablet  Commonly known as:  LASIX  Take 1 tablet (20 mg total) by mouth daily. Monday, wed, and fridays     hydrALAZINE 25 MG tablet  Commonly known as:  APRESOLINE  Take 1 tablet (25 mg total) by mouth every 8 (eight) hours.     hydrocortisone cream 1 %  Apply 1 application  topically 3 (three) times daily as needed for itching (skin irritation).     levothyroxine 75 MCG tablet  Commonly known as:  SYNTHROID, LEVOTHROID  Take 75 mcg by mouth daily before breakfast.     losartan 100 MG tablet  Commonly known as:  COZAAR  Take 100 mg by mouth daily.     metoprolol succinate 25 MG 24 hr tablet  Commonly known as:  TOPROL-XL  Take 25 mg by mouth daily.     MULTIVITAMIN/IRON PO  Take by mouth daily.     pantoprazole 40 MG tablet  Commonly known as:  PROTONIX  Take 40 mg by mouth daily.     piroxicam 20 MG capsule  Commonly known as:  [name]  Take 20 mg by mouth as needed. To use sparingly     Rivaroxaban 15 MG Tabs tablet  Commonly known as:  XARELTO  Take 1 tablet (15 mg total) by mouth daily with supper.       Follow-up Information   Follow up with Darlin Coco, MD. (our office will call you on Monday with date and time.  )    Specialty:  Cardiology   Contact information:   [address] [place] [address] [phone]        Discharge Instructions:  Weigh daily Call [phone] if weight climbs more than 3 pounds in a  day or 5 pounds in a week. No salt to very little salt in your diet.  No more than 2000 mg in a day. Call if increased shortness of breath or increased swelling.  Heart Healthy low salt diet.  We have added new meds to improve Blood Pressure control.    You have a urinary tract infection- you will need to complete antibiotic cephalexin   Signed: [id number],GYKZL R Nurse Practitioner-Certified [facility]: [facility] 12/11/2013, 8:34 AM  Time spent on discharge : >30 minutes.

## 2013-12-11 NOTE — Progress Notes (Addendum)
TELEMETRY: Reviewed telemetry NSR.: Filed Vitals:   12/11/13 0000 12/11/13 0019 12/11/13 0500 12/11/13 0727  BP: 131/41  107/50 114/42  Pulse:    76  Temp:  98 F (36.7 C) 97.9 F (36.6 C) 98.4 F (36.9 C)  TempSrc:  Oral Oral Oral  Resp:    18  Height:      Weight:   152 lb 9.6 oz (69.219 kg)   SpO2:  96%  98%    Intake/Output Summary (Last 24 hours) at 12/11/13 0736 Last data filed at 12/11/13 0600  Gross per 24 hour  Intake    940 ml  Output    354 ml  Net    586 ml   Filed Weights   12/09/13 1300 12/10/13 1300 12/11/13 0500  Weight: 151 lb 4.8 oz (68.629 kg) 140 lb (63.504 kg) 152 lb 9.6 oz (69.219 kg)    Subjective Feels well. Denies chest pain or SOB.   . cefTRIAXone (ROCEPHIN)  IV  1 g Intravenous Q24H  . flecainide  100 mg Oral BID  . furosemide  20 mg Oral Daily  . hydrALAZINE  25 mg Oral 3 times per day  . levothyroxine  75 mcg Oral QAC breakfast  . losartan  100 mg Oral Daily  . metoprolol succinate  25 mg Oral Daily  . multivitamins with iron  1 tablet Oral Daily  . pantoprazole  40 mg Oral Daily  . Rivaroxaban  15 mg Oral Q supper  . sodium chloride  3 mL Intravenous Q12H  . sodium chloride  3 mL Intravenous Q12H  . sodium chloride  3 mL Intravenous Q12H   . sodium chloride    . sodium chloride      LABS: Basic Metabolic Panel:  Recent Labs  12/09/13 1438 12/10/13 0138 12/11/13 0248  NA  --  135* 136*  K  --  4.3 4.3  CL  --  96 96  CO2  --  26 24  GLUCOSE  --  98 96  BUN  --  28* 29*  CREATININE  --  1.48* 1.32*  CALCIUM  --  9.2 8.9  MG 1.5 2.0  --    Liver Function Tests: No results found for this basename: AST, ALT, ALKPHOS, BILITOT, PROT, ALBUMIN,  in the last 72 hours No results found for this basename: LIPASE, AMYLASE,  in the last 72 hours CBC:  Recent Labs  12/09/13 0655  WBC 5.3  NEUTROABS 3.4  HGB 11.4*  HCT 34.3*  MCV 96.6  PLT 203   Cardiac Enzymes:  Recent Labs  12/09/13 1315 12/09/13 1915  12/10/13 0138  TROPONINI <0.30 <0.30 <0.30   BNP:  Recent Labs  12/09/13 0655  PROBNP 7619.0*   D-Dimer: No results found for this basename: DDIMER,  in the last 72 hours Hemoglobin A1C: No results found for this basename: HGBA1C,  in the last 72 hours Fasting Lipid Panel: No results found for this basename: CHOL, HDL, LDLCALC, TRIG, CHOLHDL, LDLDIRECT,  in the last 72 hours Thyroid Function Tests:  Recent Labs  12/09/13 1438  TSH 1.150     Radiology/Studies:  No results found. Echo: 11/08/13: Study Conclusions  - Left ventricle: Inferobasal hypokinesis. The cavity size was normal. Wall thickness was normal. Systolic function was normal. The estimated ejection fraction was in the range of 50% to 55%. - Mitral valve: Type 3 dysfunction with restricted posterior leaflet motion. Calcified annulus. There was moderate regurgitation. - Left atrium: The atrium was  mildly to moderately dilated. - Atrial septum: No defect or patent foramen ovale was identified. - Pulmonary arteries: PA peak pressure: 51 mm Hg (S). - Pericardium, extracardiac: A trivial pericardial effusion was identified posterior to the heart. - Impressions: Elevated systolic velocity in the suprasternal notch suggesting coarctation or some flow acceleration near the left subclavian take off  Impressions:  - Elevated systolic velocity in the suprasternal notch suggesting coarctation or some flow acceleration near the left subclavian take off   PHYSICAL EXAM General: Well developed, well nourished, in no acute distress. Head: Normocephalic, atraumatic, sclera non-icteric, oropharynx is clear Neck: Negative for carotid bruits. JVD not elevated. No adenopathy Lungs: Clear bilaterally to auscultation without wheezes, rales, or rhonchi. Breathing is unlabored. Heart: RRR S1 S2 without murmurs, rubs, or gallops.  Abdomen: Soft, non-tender, non-distended with normoactive bowel sounds. No hepatomegaly. No  rebound/guarding. No obvious abdominal masses. Msk:  Strength and tone appears normal for age. Extremities: No clubbing, cyanosis or edema.  Distal pedal pulses are 2+ and equal bilaterally. Neuro: Alert and oriented X 3. Moves all extremities spontaneously. Psych:  Responds to questions appropriately with a normal affect.  ASSESSMENT AND PLAN: 1. Recurrent atrial fibrillation. On Xarelto. On Flecainide 100 mg bid. Has been in NSR for 7 months on Flecainide. Had DCCV yesterday with conversion to NSR. Within one hour had recurrent Afib. Later went back into NSR and has maintained this overnight. Plan to DC today on Flecainide and metoprolol. Follow up with Dr. Mare Ferrari. If she has recurrent afib will need to consider alternative Rx. Possibly with amiodarone.   2. Acute on chronic diastolic CHF. Continue Lasix  po.   3. MR.   4. HTN- improved control on hydralazine.   5. CKD stage 3.   6. UTI. Gram negative rods on culture. ID and sensitivities pending. Has received 2 doses of IV Rocephin. Will switch to Keflex for total of 5 days antibiotics pending sensitivities.   Present on Admission:  . CHF exacerbation . CHF (congestive heart failure)  Signed, Peter Martinique, [place] 12/11/2013 7:36 AM

## 2013-12-11 NOTE — Progress Notes (Signed)
Discharge instructions given to pt; pt verbalizes understanding c questions encouraged and answered; IV dc'd per protocol, pt called neighbor/friend for transport home from facility; will continue to monitor

## 2013-12-11 NOTE — Discharge Instructions (Signed)
Weigh daily Call [phone] if weight climbs more than 3 pounds in a day or 5 pounds in a week. No salt to very little salt in your diet.  No more than 2000 mg in a day. Call if increased shortness of breath or increased swelling.  Heart Healthy low salt diet.  We have added new meds to improve Blood Pressure control.   You have a urinary tract infection- you will need to complete antibiotic cephalexin   Information on my medicine - XARELTO (Rivaroxaban)  This medication education was reviewed with me or my healthcare representative as part of my discharge preparation.  The pharmacist that spoke with me during my hospital stay was:    Why was Xarelto prescribed for you? Xarelto was prescribed for you to reduce the risk of a blood clot forming that can cause a stroke if you have a medical condition called atrial fibrillation (a type of irregular heartbeat).  What do you need to know about xarelto ? Take your Xarelto ONCE DAILY at the same time every day with your evening meal. If you have difficulty swallowing the tablet whole, you may crush it and mix in applesauce just prior to taking your dose.  Take Xarelto exactly as prescribed by your doctor and DO NOT stop taking Xarelto without talking to the doctor who prescribed the medication.  Stopping without other stroke prevention medication to take the place of Xarelto may increase your risk of developing a clot that causes a stroke.  Refill your prescription before you run out.  After discharge, you should have regular check-up appointments with your healthcare provider that is prescribing your Xarelto.  In the future your dose may need to be changed if your kidney function or weight changes by a significant amount.  What do you do if you miss a dose? If you are taking Xarelto ONCE DAILY and you miss a dose, take it as soon as you remember on the same day then continue your regularly scheduled once daily regimen the next day. Do not  take two doses of Xarelto at the same time or on the same day.   Important Safety Information A possible side effect of Xarelto is bleeding. You should call your healthcare provider right away if you experience any of the following:   Bleeding from an injury or your nose that does not stop.   Unusual colored urine (red or dark brown) or unusual colored stools (red or black).   Unusual bruising for unknown reasons.   A serious fall or if you hit your head (even if there is no bleeding).  Some medicines may interact with Xarelto and might increase your risk of bleeding while on Xarelto. To help avoid this, consult your healthcare provider or pharmacist prior to using any new prescription or non-prescription medications, including herbals, vitamins, non-steroidal anti-inflammatory drugs (NSAIDs) and supplements.  This website has more information on Xarelto: [email or web address]

## 2013-12-12 NOTE — Discharge Summary (Signed)
Patient seen and examined and history reviewed. Agree with above findings and plan. Please see my earlier rounding note.  Peter Martinique, [place] 12/12/2013 10:08 AM

## 2013-12-14 ENCOUNTER — Encounter (HOSPITAL_COMMUNITY): Payer: Self-pay | Admitting: Cardiology

## 2013-12-22 ENCOUNTER — Encounter: Payer: Medicare Other | Admitting: Cardiology

## 2013-12-22 ENCOUNTER — Other Ambulatory Visit: Payer: Medicare Other

## 2014-01-07 ENCOUNTER — Encounter: Payer: Self-pay | Admitting: Cardiology

## 2014-01-14 ENCOUNTER — Other Ambulatory Visit: Payer: Self-pay

## 2014-01-14 MED ORDER — FUROSEMIDE 20 MG PO TABS: 20.0000 mg | ORAL_TABLET | Freq: Every day | ORAL | Status: AC

## 2014-01-15 ENCOUNTER — Emergency Department (HOSPITAL_BASED_OUTPATIENT_CLINIC_OR_DEPARTMENT_OTHER)
Admission: EM | Admit: 2014-01-15 | Discharge: 2014-01-15 | Disposition: A | Discharge status: Other Acute Inpatient Hospital | Payer: Medicare Other | Attending: Emergency Medicine | Admitting: Emergency Medicine

## 2014-01-15 ENCOUNTER — Emergency Department (HOSPITAL_BASED_OUTPATIENT_CLINIC_OR_DEPARTMENT_OTHER): Payer: Medicare Other

## 2014-01-15 ENCOUNTER — Encounter (HOSPITAL_BASED_OUTPATIENT_CLINIC_OR_DEPARTMENT_OTHER): Payer: Self-pay | Admitting: Emergency Medicine

## 2014-01-15 DIAGNOSIS — Z79899 Other long term (current) drug therapy: Secondary | ICD-10-CM | POA: Insufficient documentation

## 2014-01-15 DIAGNOSIS — R0602 Shortness of breath: Secondary | ICD-10-CM | POA: Insufficient documentation

## 2014-01-15 DIAGNOSIS — E079 Disorder of thyroid, unspecified: Secondary | ICD-10-CM | POA: Diagnosis not present

## 2014-01-15 DIAGNOSIS — Z87891 Personal history of nicotine dependence: Secondary | ICD-10-CM | POA: Insufficient documentation

## 2014-01-15 DIAGNOSIS — Z9889 Other specified postprocedural states: Secondary | ICD-10-CM | POA: Insufficient documentation

## 2014-01-15 DIAGNOSIS — I5031 Acute diastolic (congestive) heart failure: Secondary | ICD-10-CM | POA: Insufficient documentation

## 2014-01-15 DIAGNOSIS — N179 Acute kidney failure, unspecified: Secondary | ICD-10-CM | POA: Diagnosis not present

## 2014-01-15 DIAGNOSIS — I509 Heart failure, unspecified: Secondary | ICD-10-CM

## 2014-01-15 DIAGNOSIS — Z7901 Long term (current) use of anticoagulants: Secondary | ICD-10-CM | POA: Insufficient documentation

## 2014-01-15 DIAGNOSIS — Z9861 Coronary angioplasty status: Secondary | ICD-10-CM | POA: Diagnosis not present

## 2014-01-15 DIAGNOSIS — I4891 Unspecified atrial fibrillation: Secondary | ICD-10-CM | POA: Diagnosis not present

## 2014-01-15 DIAGNOSIS — Z87440 Personal history of urinary (tract) infections: Secondary | ICD-10-CM | POA: Diagnosis not present

## 2014-01-15 DIAGNOSIS — R011 Cardiac murmur, unspecified: Secondary | ICD-10-CM | POA: Diagnosis not present

## 2014-01-15 DIAGNOSIS — Z853 Personal history of malignant neoplasm of breast: Secondary | ICD-10-CM | POA: Insufficient documentation

## 2014-01-15 DIAGNOSIS — I10 Essential (primary) hypertension: Secondary | ICD-10-CM | POA: Insufficient documentation

## 2014-01-15 LAB — BASIC METABOLIC PANEL
Anion gap: 11 (ref 5–15)
BUN: 18 mg/dL (ref 6–23)
CALCIUM: 9.5 mg/dL (ref 8.4–10.5)
CHLORIDE: 100 meq/L (ref 96–112)
CO2: 25 mEq/L (ref 19–32)
Creatinine, Ser: 1.2 mg/dL — ABNORMAL HIGH (ref 0.50–1.10)
GFR calc Af Amer: 50 mL/min — ABNORMAL LOW (ref >90)
GFR calc non Af Amer: 43 mL/min — ABNORMAL LOW (ref >90)
GLUCOSE: 111 mg/dL — AB (ref 70–99)
Potassium: 4.7 mEq/L (ref 3.7–5.3)
Sodium: 136 mEq/L — ABNORMAL LOW (ref 137–147)

## 2014-01-15 LAB — CBC WITH DIFFERENTIAL/PLATELET
BASOS ABS: 0 10*3/uL (ref 0.0–0.1)
Basophils Relative: 1 % (ref 0–1)
EOS PCT: 2 % (ref 0–5)
Eosinophils Absolute: 0.1 10*3/uL (ref 0.0–0.7)
HEMATOCRIT: 32.6 % — AB (ref 36.0–46.0)
HEMOGLOBIN: 10.8 g/dL — AB (ref 12.0–15.0)
LYMPHS ABS: 0.5 10*3/uL — AB (ref 0.7–4.0)
LYMPHS PCT: 8 % — AB (ref 12–46)
MCH: 31 pg (ref 26.0–34.0)
MCHC: 33.1 g/dL (ref 30.0–36.0)
MCV: 93.7 fL (ref 78.0–100.0)
MONO ABS: 0.6 10*3/uL (ref 0.1–1.0)
MONOS PCT: 10 % (ref 3–12)
NEUTROS ABS: 5.2 10*3/uL (ref 1.7–7.7)
Neutrophils Relative %: 80 % — ABNORMAL HIGH (ref 43–77)
Platelets: 174 10*3/uL (ref 150–400)
RBC: 3.48 MIL/uL — ABNORMAL LOW (ref 3.87–5.11)
RDW: 14.9 % (ref 11.5–15.5)
WBC: 6.6 10*3/uL (ref 4.0–10.5)

## 2014-01-15 LAB — TROPONIN I: Troponin I: <0.3 ng/mL (ref <0.30)

## 2014-01-15 LAB — PRO B NATRIURETIC PEPTIDE: Pro B Natriuretic peptide (BNP): 12241 pg/mL — ABNORMAL HIGH (ref 0–450)

## 2014-01-15 MED ORDER — FUROSEMIDE 10 MG/ML IJ SOLN
40.0000 mg | Freq: Once | INTRAMUSCULAR | Status: AC
  Administered 2014-01-15: 40 mg via INTRAVENOUS
  Filled 2014-01-15: qty 4

## 2014-01-15 MED ORDER — NITROGLYCERIN 2 % TD OINT
1.0000 [in_us] | TOPICAL_OINTMENT | Freq: Once | TRANSDERMAL | Status: AC
  Administered 2014-01-15: 1 [in_us] via TOPICAL
  Filled 2014-01-15: qty 1

## 2014-01-15 MED ORDER — ACETAMINOPHEN 325 MG PO TABS
975.0000 mg | ORAL_TABLET | Freq: Once | ORAL | Status: AC
  Administered 2014-01-15: 975 mg via ORAL
  Filled 2014-01-15: qty 3

## 2014-01-15 NOTE — ED Notes (Signed)
[facility] at bedside 

## 2014-01-15 NOTE — ED Notes (Signed)
[facility] called to transport pt to [facility], room [address] RN aware

## 2014-01-15 NOTE — ED Notes (Signed)
Patient transported to X-ray 

## 2014-01-15 NOTE — ED Notes (Addendum)
Pt reports SOB that started this morning after waking up.  Noted to have expiratory wheezing.  Denies N/V.  Denies CP.  Pt speaking in complete sentences, no acute distress noted.

## 2014-01-15 NOTE — ED Provider Notes (Addendum)
CSN: [id number]     Arrival date & time 01/15/14  1008 History   First MD Initiated Contact with Patient 01/15/14 1025     Chief Complaint  Patient presents with  . Shortness of Breath     (Consider location/radiation/quality/duration/timing/severity/associated sxs/prior Treatment) HPI Complains of shortness of breath upon awakening 8:30 AM today. Nothing makes symptoms better or worse. No other associated symptoms. No chest pain no fever no cough no treatment prior to coming here Past Medical History  Diagnosis Date  . Atrial fibrillation   . Hypertension   . Thyroid disease   . Aortic aneurysm with dissection   . Breast CA   . Acute renal failure (ARF)   . CHF (congestive heart failure)   . Aortic aneurysm, descending   . Acute diastolic heart failure, in combination with MR and atrial fib 12/11/2013  . UTI (urinary tract infection) 12/11/2013  . Hypomagnesemia, replaced 12/11/2013   Past Surgical History  Procedure Laterality Date  . Ascending aortic aneurysm repair    . Descending aortic aneurysm repair    . Breast lumpectomy    . Abdominal hysterectomy    . Cardioversion N/A 12/10/2013    Procedure: CARDIOVERSION   (bedside);  Surgeon: Peter M Martinique, MD;  Location: [facility];  Service: Cardiovascular;  Laterality: N/A;   History reviewed. No pertinent family history. History  Substance Use Topics  . Smoking status: Former Research scientist (life sciences)  . Smokeless tobacco: Not on file  . Alcohol Use: No   OB History   Grav Para Term Preterm Abortions TAB SAB Ect Mult Living                 Review of Systems  Constitutional: Negative.   HENT: Negative.   Respiratory: Positive for shortness of breath.   Cardiovascular: Negative.   Gastrointestinal: Negative.   Musculoskeletal: Negative.   Skin: Negative.   Neurological: Negative.   Psychiatric/Behavioral: Negative.   All other systems reviewed and are negative.     Allergies  Review of patient's allergies indicates no known  allergies.  Home Medications   Prior to Admission medications   Medication Sig Start Date End Date Taking? Authorizing Provider  cephALEXin (KEFLEX) 250 MG capsule Take 1 capsule (250 mg total) by mouth every 8 (eight) hours. 12/11/13   Cecilie Kicks, NP  flecainide (TAMBOCOR) 100 MG tablet Take 100 mg by mouth 2 (two) times daily.    Historical Provider, MD  furosemide (LASIX) 20 MG tablet Take 1 tablet (20 mg total) by mouth daily. 01/14/14   Darlin Coco, MD  hydrALAZINE (APRESOLINE) 25 MG tablet Take 1 tablet (25 mg total) by mouth every 8 (eight) hours. 12/11/13   Cecilie Kicks, NP  hydrocortisone cream 1 % Apply 1 application topically 3 (three) times daily as needed for itching (skin irritation). 12/11/13   Cecilie Kicks, NP  levothyroxine (SYNTHROID, LEVOTHROID) 75 MCG tablet Take 75 mcg by mouth daily before breakfast.    Historical Provider, MD  losartan (COZAAR) 100 MG tablet Take 100 mg by mouth daily.    Historical Provider, MD  metoprolol succinate (TOPROL-XL) 25 MG 24 hr tablet Take 25 mg by mouth daily.    Historical Provider, MD  Multiple Vitamins-Iron (MULTIVITAMIN/IRON PO) Take by mouth daily.    Historical Provider, MD  pantoprazole (PROTONIX) 40 MG tablet Take 40 mg by mouth daily.    Historical Provider, MD  piroxicam ([name]) 20 MG capsule Take 20 mg by mouth as needed. To use sparingly  Historical Provider, MD  Rivaroxaban (XARELTO) 15 MG TABS tablet Take 1 tablet (15 mg total) by mouth daily with supper. 11/19/13   Darlin Coco, MD   BP 163/81  Pulse 93  Temp(Src) 98.6 F (37 C) (Oral)  Resp 24  Ht 5\' 5"  (1.651 m)  Wt 150 lb (68.04 kg)  BMI 24.96 kg/m2  SpO2 96% Physical Exam  Nursing note and vitals reviewed. Constitutional: She is oriented to person, place, and time. She appears well-developed and well-nourished.  HENT:  Head: Normocephalic and atraumatic.  Eyes: Conjunctivae are normal. Pupils are equal, round, and reactive to light.  Neck: Neck  supple. JVD present. No tracheal deviation present. No thyromegaly present.  Cardiovascular: Normal rate.   Murmur heard. Irregularly irregular 3/6 systolic ejection murmur  Pulmonary/Chest: Effort normal and breath sounds normal.  Abdominal: Soft. Bowel sounds are normal. She exhibits no distension. There is no tenderness.  Musculoskeletal: Normal range of motion. She exhibits edema. She exhibits no tenderness.  Trace pretibial pitting edema  Neurological: She is alert and oriented to person, place, and time. Coordination normal.  Skin: Skin is warm and dry. No rash noted.  Psychiatric: She has a normal mood and affect.    ED Course  Procedures (including critical care time) Labs Review Labs Reviewed - No data to display  Imaging Review No results found.   EKG Interpretation   Date/Time:  Saturday January 15 2014 10:19:21 EDT Ventricular Rate:  79 PR Interval:    QRS Duration: 102 QT Interval:  388 QTC Calculation: 444 R Axis:   88 Text Interpretation:  Atrial fibrillation Septal infarct , age  undetermined Abnormal ECG Confirmed by Winfred Leeds  MD, Renuka Farfan [phone]) on  01/15/2014 10:44:59 AM      Results for orders placed during the hospital encounter of 01/15/14  TROPONIN I      Result Value Ref Range   Troponin I <0.30  <0.30 ng/mL  CBC WITH DIFFERENTIAL      Result Value Ref Range   WBC 6.6  4.0 - 10.5 K/uL   RBC 3.48 (*) 3.87 - 5.11 MIL/uL   Hemoglobin 10.8 (*) 12.0 - 15.0 g/dL   HCT 32.6 (*) 36.0 - 46.0 %   MCV 93.7  78.0 - 100.0 fL   MCH 31.0  26.0 - 34.0 pg   MCHC 33.1  30.0 - 36.0 g/dL   RDW 14.9  11.5 - 15.5 %   Platelets 174  150 - 400 K/uL   Neutrophils Relative % 80 (*) 43 - 77 %   Neutro Abs 5.2  1.7 - 7.7 K/uL   Lymphocytes Relative 8 (*) 12 - 46 %   Lymphs Abs 0.5 (*) 0.7 - 4.0 K/uL   Monocytes Relative 10  3 - 12 %   Monocytes Absolute 0.6  0.1 - 1.0 K/uL   Eosinophils Relative 2  0 - 5 %   Eosinophils Absolute 0.1  0.0 - 0.7 K/uL   Basophils  Relative 1  0 - 1 %   Basophils Absolute 0.0  0.0 - 0.1 K/uL  BASIC METABOLIC PANEL      Result Value Ref Range   Sodium 136 (*) 137 - 147 mEq/L   Potassium 4.7  3.7 - 5.3 mEq/L   Chloride 100  96 - 112 mEq/L   CO2 25  19 - 32 mEq/L   Glucose, Bld 111 (*) 70 - 99 mg/dL   BUN 18  6 - 23 mg/dL   Creatinine, Ser 1.20 (*)  0.50 - 1.10 mg/dL   Calcium 9.5  8.4 - 10.5 mg/dL   GFR calc non Af Amer 43 (*) >90 mL/min   GFR calc Af Amer 50 (*) >90 mL/min   Anion gap 11  5 - 15  PRO B NATRIURETIC PEPTIDE      Result Value Ref Range   Pro B Natriuretic peptide (BNP) 12241.0 (*) 0 - 450 pg/mL   Dg Chest 2 View  01/15/2014   CLINICAL DATA:  Shortness of breath.  EXAM: CHEST  2 VIEW  COMPARISON:  Portable chest x-ray 12/09/2013. Two-view chest x-ray 09/30/2013.  FINDINGS: Thoracic aortic stent graft. Prior sternotomy. Cardiac silhouette moderately enlarged but stable. Pulmonary vascularity normal without evidence of pulmonary edema. Lungs clear. No pleural effusions. No pneumothorax. 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  IMPRESSION: Stable cardiomegaly.  No acute cardiopulmonary disease.   Electronically Signed   By: Evangeline Dakin M.D.   On: 01/15/2014 11:33    Received Lasix 40 mg IV at 12:15 PM at 3 PM patient is dyspneic on getting up to walk to the bathroom. Pulse oximetry desaturated to 87% on r. oom air when pt gets up to walk. Pt placed on suplemental oxygenShe has diuresed well in the emergency department MDM  Spoke with Dr.Vasu, cardiologist at [facility] the patient's request. Plan she will be transferred to medical surgical floor Diagnosis #1 congestive heart failure #2 hypoxemia #3 anemia #4 mild renal insufficiency Final diagnoses:  None        Orlie Dakin, MD 01/15/14 1518  4:15 PM patient alert and ambulatory and comfortable STable for transfer  Orlie Dakin, MD 01/15/14 [place], MD 01/15/14 1622

## 2014-01-15 NOTE — ED Notes (Signed)
Report given to Farley Ly at [facility] has been called. Pending for [facility]

## 2014-01-15 NOTE — ED Notes (Signed)
Report given to [facility]. Patient updated on care.

## 2015-06-14 ENCOUNTER — Emergency Department (HOSPITAL_BASED_OUTPATIENT_CLINIC_OR_DEPARTMENT_OTHER): Payer: Medicare Other

## 2015-06-14 ENCOUNTER — Encounter (HOSPITAL_BASED_OUTPATIENT_CLINIC_OR_DEPARTMENT_OTHER): Payer: Self-pay | Admitting: *Deleted

## 2015-06-14 ENCOUNTER — Emergency Department (HOSPITAL_BASED_OUTPATIENT_CLINIC_OR_DEPARTMENT_OTHER)
Admission: EM | Admit: 2015-06-14 | Discharge: 2015-06-15 | Disposition: A | Discharge status: Home / Self Care | Payer: Medicare Other | Attending: Emergency Medicine | Admitting: Emergency Medicine

## 2015-06-14 DIAGNOSIS — Z8742 Personal history of other diseases of the female genital tract: Secondary | ICD-10-CM | POA: Insufficient documentation

## 2015-06-14 DIAGNOSIS — Y9289 Other specified places as the place of occurrence of the external cause: Secondary | ICD-10-CM | POA: Diagnosis not present

## 2015-06-14 DIAGNOSIS — S0990XA Unspecified injury of head, initial encounter: Secondary | ICD-10-CM | POA: Diagnosis present

## 2015-06-14 DIAGNOSIS — Z853 Personal history of malignant neoplasm of breast: Secondary | ICD-10-CM | POA: Diagnosis not present

## 2015-06-14 DIAGNOSIS — I509 Heart failure, unspecified: Secondary | ICD-10-CM | POA: Insufficient documentation

## 2015-06-14 DIAGNOSIS — Z87440 Personal history of urinary (tract) infections: Secondary | ICD-10-CM | POA: Insufficient documentation

## 2015-06-14 DIAGNOSIS — S8001XA Contusion of right knee, initial encounter: Secondary | ICD-10-CM | POA: Diagnosis not present

## 2015-06-14 DIAGNOSIS — S60221A Contusion of right hand, initial encounter: Secondary | ICD-10-CM | POA: Diagnosis not present

## 2015-06-14 DIAGNOSIS — S01511A Laceration without foreign body of lip, initial encounter: Secondary | ICD-10-CM | POA: Insufficient documentation

## 2015-06-14 DIAGNOSIS — W010XXA Fall on same level from slipping, tripping and stumbling without subsequent striking against object, initial encounter: Secondary | ICD-10-CM | POA: Insufficient documentation

## 2015-06-14 DIAGNOSIS — Z79899 Other long term (current) drug therapy: Secondary | ICD-10-CM | POA: Insufficient documentation

## 2015-06-14 DIAGNOSIS — Z87891 Personal history of nicotine dependence: Secondary | ICD-10-CM | POA: Diagnosis not present

## 2015-06-14 DIAGNOSIS — Z7901 Long term (current) use of anticoagulants: Secondary | ICD-10-CM | POA: Diagnosis not present

## 2015-06-14 DIAGNOSIS — S0083XA Contusion of other part of head, initial encounter: Secondary | ICD-10-CM

## 2015-06-14 DIAGNOSIS — S60222A Contusion of left hand, initial encounter: Secondary | ICD-10-CM | POA: Insufficient documentation

## 2015-06-14 DIAGNOSIS — I4891 Unspecified atrial fibrillation: Secondary | ICD-10-CM | POA: Diagnosis not present

## 2015-06-14 DIAGNOSIS — I5031 Acute diastolic (congestive) heart failure: Secondary | ICD-10-CM | POA: Diagnosis not present

## 2015-06-14 DIAGNOSIS — Y9389 Activity, other specified: Secondary | ICD-10-CM | POA: Diagnosis not present

## 2015-06-14 DIAGNOSIS — Y998 Other external cause status: Secondary | ICD-10-CM | POA: Diagnosis not present

## 2015-06-14 DIAGNOSIS — E079 Disorder of thyroid, unspecified: Secondary | ICD-10-CM | POA: Diagnosis not present

## 2015-06-14 DIAGNOSIS — I10 Essential (primary) hypertension: Secondary | ICD-10-CM | POA: Diagnosis not present

## 2015-06-14 MED ORDER — ACETAMINOPHEN 325 MG PO TABS
650.0000 mg | ORAL_TABLET | Freq: Once | ORAL | Status: AC
  Administered 2015-06-14: 650 mg via ORAL

## 2015-06-14 MED ORDER — ACETAMINOPHEN 325 MG PO TABS
ORAL_TABLET | ORAL | Status: AC
  Filled 2015-06-14: qty 2

## 2015-06-14 NOTE — ED Notes (Signed)
Brought in by EMS for fall from standing landing on cement, injury head and face with lac to lower lip inside and out.

## 2015-06-14 NOTE — Discharge Instructions (Signed)

## 2015-06-14 NOTE — ED Provider Notes (Signed)
CSN: [id number]     Arrival date & time 06/14/15  2126 History  By signing my name below, I, Randa Evens, attest that this documentation has been prepared under the direction and in the presence of Tanna Furry, MD. Electronically Signed: Randa Evens, ED Scribe. 06/14/2015. 11:09 PM.    Chief Complaint  Patient presents with  . Head Injury   Patient is a 79 y.o. female presenting with head injury. The history is provided by the patient. No language interpreter was used.  Head Injury Associated symptoms: no headaches, no nausea and no vomiting    HPI Comments: Beth [name] is a 79 y.o. female brought in by ambulance, who presents to the Emergency Department complaining of new fall onset tonight PTA. Pt states that she tripped over uneven concrete causing her to fall straight onto her face. Pt presents with laceration to lower lip, abrasion to nose, several abrasion to her hands and right knee pain. Pt does report that her lower teeth are loose. Pt denies LOC, or neck pain. Pt is states that she is on Xarelto.     Past Medical History  Diagnosis Date  . Atrial fibrillation ([place])   . Hypertension   . Thyroid disease   . Aortic aneurysm with dissection ([place])   . Breast CA ([place])   . Acute renal failure (ARF) ([place])   . CHF (congestive heart failure) ([place])   . Aortic aneurysm, descending ([place])   . Acute diastolic heart failure, in combination with MR and atrial fib 12/11/2013  . UTI (urinary tract infection) 12/11/2013  . Hypomagnesemia, replaced 12/11/2013   Past Surgical History  Procedure Laterality Date  . Ascending aortic aneurysm repair    . Descending aortic aneurysm repair    . Breast lumpectomy    . Abdominal hysterectomy    . Cardioversion N/A 12/10/2013    Procedure: CARDIOVERSION   (bedside);  Surgeon: Peter M Martinique, MD;  Location: [facility];  Service: Cardiovascular;  Laterality: N/A;   No family history on file. Social History  Substance Use Topics  . Smoking  status: Former Research scientist (life sciences)  . Smokeless tobacco: None  . Alcohol Use: No   OB History    No data available       Review of Systems  Constitutional: Negative for fever, chills, diaphoresis, appetite change and fatigue.  HENT: Negative for mouth sores, sore throat and trouble swallowing.   Eyes: Negative for visual disturbance.  Respiratory: Negative for cough, chest tightness, shortness of breath and wheezing.   Cardiovascular: Negative for chest pain.  Gastrointestinal: Negative for nausea, vomiting, abdominal pain, diarrhea and abdominal distention.  Endocrine: Negative for polydipsia, polyphagia and polyuria.  Genitourinary: Negative for dysuria, frequency and hematuria.  Musculoskeletal: Positive for arthralgias. Negative for gait problem.  Skin: Positive for wound. Negative for color change, pallor and rash.  Neurological: Negative for dizziness, syncope, light-headedness and headaches.  Hematological: Does not bruise/bleed easily.  Psychiatric/Behavioral: Negative for behavioral problems and confusion.     Allergies  Review of patient's allergies indicates no known allergies.  Home Medications   Prior to Admission medications   Medication Sig Start Date End Date Taking? Authorizing Provider  cephALEXin (KEFLEX) 250 MG capsule Take 1 capsule (250 mg total) by mouth every 8 (eight) hours. 12/11/13   Isaiah Serge, NP  flecainide (TAMBOCOR) 100 MG tablet Take 100 mg by mouth 2 (two) times daily.    Historical Provider, MD  furosemide (LASIX) 20 MG tablet Take 1 tablet (20  mg total) by mouth daily. 01/14/14   Darlin Coco, MD  hydrALAZINE (APRESOLINE) 25 MG tablet Take 1 tablet (25 mg total) by mouth every 8 (eight) hours. 12/11/13   Isaiah Serge, NP  hydrocortisone cream 1 % Apply 1 application topically 3 (three) times daily as needed for itching (skin irritation). 12/11/13   Isaiah Serge, NP  levothyroxine (SYNTHROID, LEVOTHROID) 75 MCG tablet Take 75 mcg by mouth daily  before breakfast.    Historical Provider, MD  losartan (COZAAR) 100 MG tablet Take 100 mg by mouth daily.    Historical Provider, MD  metoprolol succinate (TOPROL-XL) 25 MG 24 hr tablet Take 25 mg by mouth daily.    Historical Provider, MD  Multiple Vitamins-Iron (MULTIVITAMIN/IRON PO) Take by mouth daily.    Historical Provider, MD  pantoprazole (PROTONIX) 40 MG tablet Take 40 mg by mouth daily.    Historical Provider, MD  piroxicam ([name]) 20 MG capsule Take 20 mg by mouth as needed. To use sparingly    Historical Provider, MD  Rivaroxaban (XARELTO) 15 MG TABS tablet Take 1 tablet (15 mg total) by mouth daily with supper. 11/19/13   Darlin Coco, MD   BP 155/94 mmHg  Pulse 67  Temp(Src) 97.5 F (36.4 C) (Oral)  Resp 18  Ht 5\' 6"  (1.676 m)  Wt 155 lb (70.308 kg)  BMI 25.03 kg/m2  SpO2 97%   Physical Exam  Constitutional: She is oriented to person, place, and time. She appears well-developed and well-nourished. No distress.  HENT:  Head: Normocephalic.  thorough and through laceration to the lower lip.   Eyes: Conjunctivae are normal. Pupils are equal, round, and reactive to light. No scleral icterus.  Neck: Normal range of motion. Neck supple. No thyromegaly present.  Cardiovascular: Normal rate and regular rhythm.  Exam reveals no gallop and no friction rub.   No murmur heard. Pulmonary/Chest: Effort normal and breath sounds normal. No respiratory distress. She has no wheezes. She has no rales.  Abdominal: Soft. Bowel sounds are normal. She exhibits no distension. There is no tenderness. There is no rebound.  Musculoskeletal: Normal range of motion.  Bruising and tenderness over right patella.  Contusion on  Both hands with full ROM  Neurological: She is alert and oriented to person, place, and time.  Skin: Skin is warm and dry. No rash noted.  Psychiatric: She has a normal mood and affect. Her behavior is normal.    ED Course  Procedures (including critical care  time) DIAGNOSTIC STUDIES: Oxygen Saturation is 96% on RA, normal by my interpretation.    COORDINATION OF CARE: 9:37 PM-Discussed treatment plan with pt at bedside and pt agreed to plan.     Labs Review Labs Reviewed - No data to display  Imaging Review Ct Head Wo Contrast  06/14/2015  CLINICAL DATA:  Fall with face abrasions.  Anti coagulation. EXAM: CT HEAD WITHOUT CONTRAST CT MAXILLOFACIAL WITHOUT CONTRAST TECHNIQUE: Multidetector CT imaging of the head and maxillofacial structures were performed using the standard protocol without intravenous contrast. Multiplanar CT image reconstructions of the maxillofacial structures were also generated. COMPARISON:  Head CT 06/29/2014 FINDINGS: CT HEAD FINDINGS Skull and Sinuses:Negative for fracture or destructive process. The visualized mastoids, middle ears, and imaged paranasal sinuses are clear. Visualized orbits: Negative. Brain: No evidence of acute infarction, hemorrhage, hydrocephalus, or mass lesion/mass effect. Patchy low-density in the cerebral white matter compatible with chronic small vessel disease, age congruent. CT MAXILLOFACIAL FINDINGS There is motion of the mandible which  on reformats gives the appearance of condylar neck fractures. However, based on head CT acquisition which is free of motion at this level, there is no upper mandible fracture. No evidence of globe injury or postseptal hematoma. Clear paranasal sinuses. IMPRESSION: No evidence of intracranial injury.  Negative for facial fracture. Electronically Signed   By: Monte Fantasia M.D.   On: 06/14/2015 22:38   Dg Knee Complete 4 Views Right  06/14/2015  CLINICAL DATA:  Right knee pain after fall.  Initial encounter. EXAM: RIGHT KNEE - COMPLETE 4+ VIEW COMPARISON:  None. FINDINGS: There is no evidence of fracture, dislocation, or joint effusion. Tricompartmental osteoarthritis with marginal spurring and joint narrowing. Osteopenia and atherosclerosis. IMPRESSION: 1. No acute  finding. 2. Tricompartmental osteoarthritis. Electronically Signed   By: Monte Fantasia M.D.   On: 06/14/2015 22:49   Ct Maxillofacial Wo Cm  06/14/2015  CLINICAL DATA:  Fall with face abrasions.  Anti coagulation. EXAM: CT HEAD WITHOUT CONTRAST CT MAXILLOFACIAL WITHOUT CONTRAST TECHNIQUE: Multidetector CT imaging of the head and maxillofacial structures were performed using the standard protocol without intravenous contrast. Multiplanar CT image reconstructions of the maxillofacial structures were also generated. COMPARISON:  Head CT 06/29/2014 FINDINGS: CT HEAD FINDINGS Skull and Sinuses:Negative for fracture or destructive process. The visualized mastoids, middle ears, and imaged paranasal sinuses are clear. Visualized orbits: Negative. Brain: No evidence of acute infarction, hemorrhage, hydrocephalus, or mass lesion/mass effect. Patchy low-density in the cerebral white matter compatible with chronic small vessel disease, age congruent. CT MAXILLOFACIAL FINDINGS There is motion of the mandible which on reformats gives the appearance of condylar neck fractures. However, based on head CT acquisition which is free of motion at this level, there is no upper mandible fracture. No evidence of globe injury or postseptal hematoma. Clear paranasal sinuses. IMPRESSION: No evidence of intracranial injury.  Negative for facial fracture. Electronically Signed   By: Monte Fantasia M.D.   On: 06/14/2015 22:38      EKG Interpretation None      MDM   Final diagnoses:  Facial contusion, initial encounter  Lip laceration, initial encounter      Through and through lip laceration is self approximating. Does not require repair here. Attempting to try to locate a friend or family might really get her to her car and/or home safely tonight. She is of normal mental status. Not repetitive. Has reassuring studies. Neurologically intact and appropriate.     Tanna Furry, MD 06/14/15 [phone]

## 2015-06-29 ENCOUNTER — Emergency Department (HOSPITAL_COMMUNITY): Payer: Medicare Other

## 2015-06-29 ENCOUNTER — Emergency Department (HOSPITAL_COMMUNITY)
Admission: EM | Admit: 2015-06-29 | Discharge: 2015-06-29 | Disposition: A | Discharge status: Home / Self Care | Payer: Medicare Other | Attending: Emergency Medicine | Admitting: Emergency Medicine

## 2015-06-29 ENCOUNTER — Encounter (HOSPITAL_COMMUNITY): Payer: Self-pay | Admitting: Vascular Surgery

## 2015-06-29 DIAGNOSIS — Z23 Encounter for immunization: Secondary | ICD-10-CM | POA: Insufficient documentation

## 2015-06-29 DIAGNOSIS — E079 Disorder of thyroid, unspecified: Secondary | ICD-10-CM | POA: Insufficient documentation

## 2015-06-29 DIAGNOSIS — W19XXXA Unspecified fall, initial encounter: Secondary | ICD-10-CM

## 2015-06-29 DIAGNOSIS — S20312A Abrasion of left front wall of thorax, initial encounter: Secondary | ICD-10-CM | POA: Insufficient documentation

## 2015-06-29 DIAGNOSIS — I10 Essential (primary) hypertension: Secondary | ICD-10-CM | POA: Diagnosis not present

## 2015-06-29 DIAGNOSIS — Z87891 Personal history of nicotine dependence: Secondary | ICD-10-CM | POA: Diagnosis not present

## 2015-06-29 DIAGNOSIS — S80211A Abrasion, right knee, initial encounter: Secondary | ICD-10-CM | POA: Diagnosis not present

## 2015-06-29 DIAGNOSIS — S8002XA Contusion of left knee, initial encounter: Secondary | ICD-10-CM | POA: Diagnosis not present

## 2015-06-29 DIAGNOSIS — Z792 Long term (current) use of antibiotics: Secondary | ICD-10-CM | POA: Diagnosis not present

## 2015-06-29 DIAGNOSIS — Y9389 Activity, other specified: Secondary | ICD-10-CM | POA: Insufficient documentation

## 2015-06-29 DIAGNOSIS — S0081XA Abrasion of other part of head, initial encounter: Secondary | ICD-10-CM | POA: Insufficient documentation

## 2015-06-29 DIAGNOSIS — S0990XA Unspecified injury of head, initial encounter: Secondary | ICD-10-CM | POA: Diagnosis present

## 2015-06-29 DIAGNOSIS — S60511A Abrasion of right hand, initial encounter: Secondary | ICD-10-CM | POA: Diagnosis not present

## 2015-06-29 DIAGNOSIS — Z79899 Other long term (current) drug therapy: Secondary | ICD-10-CM | POA: Insufficient documentation

## 2015-06-29 DIAGNOSIS — S60512A Abrasion of left hand, initial encounter: Secondary | ICD-10-CM | POA: Insufficient documentation

## 2015-06-29 DIAGNOSIS — Y92129 Unspecified place in nursing home as the place of occurrence of the external cause: Secondary | ICD-10-CM | POA: Diagnosis not present

## 2015-06-29 DIAGNOSIS — Z87440 Personal history of urinary (tract) infections: Secondary | ICD-10-CM | POA: Insufficient documentation

## 2015-06-29 DIAGNOSIS — Z7901 Long term (current) use of anticoagulants: Secondary | ICD-10-CM | POA: Diagnosis not present

## 2015-06-29 DIAGNOSIS — S80212A Abrasion, left knee, initial encounter: Secondary | ICD-10-CM | POA: Diagnosis not present

## 2015-06-29 DIAGNOSIS — S0083XA Contusion of other part of head, initial encounter: Secondary | ICD-10-CM | POA: Diagnosis not present

## 2015-06-29 DIAGNOSIS — I5031 Acute diastolic (congestive) heart failure: Secondary | ICD-10-CM | POA: Insufficient documentation

## 2015-06-29 DIAGNOSIS — Y998 Other external cause status: Secondary | ICD-10-CM | POA: Insufficient documentation

## 2015-06-29 DIAGNOSIS — Z87448 Personal history of other diseases of urinary system: Secondary | ICD-10-CM | POA: Insufficient documentation

## 2015-06-29 DIAGNOSIS — W01198A Fall on same level from slipping, tripping and stumbling with subsequent striking against other object, initial encounter: Secondary | ICD-10-CM | POA: Insufficient documentation

## 2015-06-29 DIAGNOSIS — Z853 Personal history of malignant neoplasm of breast: Secondary | ICD-10-CM | POA: Insufficient documentation

## 2015-06-29 DIAGNOSIS — R000 Tachycardia, unspecified: Secondary | ICD-10-CM | POA: Insufficient documentation

## 2015-06-29 DIAGNOSIS — S8001XA Contusion of right knee, initial encounter: Secondary | ICD-10-CM | POA: Insufficient documentation

## 2015-06-29 HISTORY — DX: Lymphedema, not elsewhere classified: I89.0

## 2015-06-29 LAB — PROTIME-INR
INR: 1.13 (ref 0.00–1.49)
Prothrombin Time: 14.6 seconds (ref 11.6–15.2)

## 2015-06-29 LAB — BASIC METABOLIC PANEL
ANION GAP: 9 (ref 5–15)
BUN: 12 mg/dL (ref 6–20)
CALCIUM: 9.4 mg/dL (ref 8.9–10.3)
CO2: 27 mmol/L (ref 22–32)
Chloride: 103 mmol/L (ref 101–111)
Creatinine, Ser: 0.97 mg/dL (ref 0.44–1.00)
GFR calc Af Amer: >60 mL/min (ref >60)
GFR calc non Af Amer: 55 mL/min — ABNORMAL LOW (ref >60)
GLUCOSE: 106 mg/dL — AB (ref 65–99)
Potassium: 4.1 mmol/L (ref 3.5–5.1)
Sodium: 139 mmol/L (ref 135–145)

## 2015-06-29 LAB — CBC WITH DIFFERENTIAL/PLATELET
BASOS PCT: 0 %
Basophils Absolute: 0 10*3/uL (ref 0.0–0.1)
EOS PCT: 1 %
Eosinophils Absolute: 0.1 10*3/uL (ref 0.0–0.7)
HEMATOCRIT: 41.5 % (ref 36.0–46.0)
Hemoglobin: 13.3 g/dL (ref 12.0–15.0)
LYMPHS PCT: 9 %
Lymphs Abs: 0.7 10*3/uL (ref 0.7–4.0)
MCH: 29.9 pg (ref 26.0–34.0)
MCHC: 32 g/dL (ref 30.0–36.0)
MCV: 93.3 fL (ref 78.0–100.0)
MONO ABS: 0.7 10*3/uL (ref 0.1–1.0)
MONOS PCT: 9 %
NEUTROS ABS: 6.3 10*3/uL (ref 1.7–7.7)
Neutrophils Relative %: 81 %
PLATELETS: 188 10*3/uL (ref 150–400)
RBC: 4.45 MIL/uL (ref 3.87–5.11)
RDW: 15.1 % (ref 11.5–15.5)
WBC: 7.8 10*3/uL (ref 4.0–10.5)

## 2015-06-29 LAB — I-STAT TROPONIN, ED: Troponin i, poc: 0.01 ng/mL (ref 0.00–0.08)

## 2015-06-29 MED ORDER — ONDANSETRON 4 MG PO TBDP
4.0000 mg | ORAL_TABLET | Freq: Once | ORAL | Status: AC
  Administered 2015-06-29: 4 mg via ORAL
  Filled 2015-06-29: qty 1

## 2015-06-29 MED ORDER — OXYCODONE-ACETAMINOPHEN 5-325 MG PO TABS
1.0000 | ORAL_TABLET | Freq: Once | ORAL | Status: AC
  Administered 2015-06-29: 1 via ORAL
  Filled 2015-06-29: qty 1

## 2015-06-29 MED ORDER — HYDROCODONE-ACETAMINOPHEN 5-325 MG PO TABS: 1.0000 | ORAL_TABLET | Freq: Four times a day (QID) | ORAL | Status: DC | PRN

## 2015-06-29 MED ORDER — TETANUS-DIPHTH-ACELL PERTUSSIS 5-2.5-18.5 LF-MCG/0.5 IM SUSP
0.5000 mL | Freq: Once | INTRAMUSCULAR | Status: AC
  Administered 2015-06-29: 0.5 mL via INTRAMUSCULAR
  Filled 2015-06-29: qty 0.5

## 2015-06-29 NOTE — Progress Notes (Signed)
Spoke with pt's son re: resources for pt/family.  Info provided on Adult Day Care, ALF placement, custodial care and elderlaw attorneys.  Emotional support also provided.

## 2015-06-29 NOTE — ED Notes (Signed)
Patient transported to X-ray 

## 2015-06-29 NOTE — ED Notes (Addendum)
Pt from [facility] via [place] with c/o tripping falling stepping up on a curb, with left head/face injury, abrasion to bilateral knuckles, abrasions to bilateral knees, and abrasion to central chest at left collar bone.  Pt disoritented to situation, oriented only to self.  EMS unknown baseline.  Madilyn Hook.  Pt in NAD, alert.

## 2015-06-29 NOTE — ED Provider Notes (Signed)
CSN: [id number]     Arrival date & time 06/29/15  1501 History   First MD Initiated Contact with Patient 06/29/15 1502     Chief Complaint  Patient presents with  . Fall  . Head Injury    HPI   Ms. Garate is an 80 y.o. female with h/o afib (on xarelto), HTN, AAA, CHF who presents to the ED for evaluation following a fall. Per EMS report, pt was at [place] and tripped over a concrete curb and fell hitting her her face, hands, and knees. In the ED pt has obvious abrasion to the left side of her face with significant periorbital ecchymosis and edema. She has multiple other abrasions on her chest, bilateral hands, and knees. Pt is initially A&Ox3 with me and is able to tell me that she fell tripping over a curb. However, throughout our conversation she has periods of confusion and does not remember where she was prior to the hospital, does not remember how she fell. Later she tells me that she is at a hospital but does not know why she is here. Unclear if this is pt's baseline mentation. Per EMS, son was notified and reportedly on his way to the hosptial.  She denies headache, dizziness, neck pain, chest pain, or SOB.   Past Medical History  Diagnosis Date  . Atrial fibrillation ([place])   . Hypertension   . Thyroid disease   . Aortic aneurysm with dissection ([place])   . Breast CA ([place])   . Acute renal failure (ARF) ([place])   . CHF (congestive heart failure) ([place])   . Aortic aneurysm, descending ([place])   . Acute diastolic heart failure, in combination with MR and atrial fib 12/11/2013  . UTI (urinary tract infection) 12/11/2013  . Hypomagnesemia, replaced 12/11/2013   Past Surgical History  Procedure Laterality Date  . Ascending aortic aneurysm repair    . Descending aortic aneurysm repair    . Breast lumpectomy    . Abdominal hysterectomy    . Cardioversion N/A 12/10/2013    Procedure: CARDIOVERSION   (bedside);  Surgeon: Peter M Martinique, MD;  Location: [facility];  Service: Cardiovascular;   Laterality: N/A;   No family history on file. Social History  Substance Use Topics  . Smoking status: Former Research scientist (life sciences)  . Smokeless tobacco: Not on file  . Alcohol Use: No   OB History    No data available     Review of Systems  All other systems reviewed and are negative.     Allergies  Review of patient's allergies indicates no known allergies.  Home Medications   Prior to Admission medications   Medication Sig Start Date End Date Taking? Authorizing Provider  cephALEXin (KEFLEX) 250 MG capsule Take 1 capsule (250 mg total) by mouth every 8 (eight) hours. 12/11/13   Isaiah Serge, NP  flecainide (TAMBOCOR) 100 MG tablet Take 100 mg by mouth 2 (two) times daily.    Historical Provider, MD  furosemide (LASIX) 20 MG tablet Take 1 tablet (20 mg total) by mouth daily. 01/14/14   Darlin Coco, MD  hydrALAZINE (APRESOLINE) 25 MG tablet Take 1 tablet (25 mg total) by mouth every 8 (eight) hours. 12/11/13   Isaiah Serge, NP  hydrocortisone cream 1 % Apply 1 application topically 3 (three) times daily as needed for itching (skin irritation). 12/11/13   Isaiah Serge, NP  levothyroxine (SYNTHROID, LEVOTHROID) 75 MCG tablet Take 75 mcg by mouth daily before breakfast.    Historical  Provider, MD  losartan (COZAAR) 100 MG tablet Take 100 mg by mouth daily.    Historical Provider, MD  metoprolol succinate (TOPROL-XL) 25 MG 24 hr tablet Take 25 mg by mouth daily.    Historical Provider, MD  Multiple Vitamins-Iron (MULTIVITAMIN/IRON PO) Take by mouth daily.    Historical Provider, MD  pantoprazole (PROTONIX) 40 MG tablet Take 40 mg by mouth daily.    Historical Provider, MD  piroxicam ([name]) 20 MG capsule Take 20 mg by mouth as needed. To use sparingly    Historical Provider, MD  Rivaroxaban (XARELTO) 15 MG TABS tablet Take 1 tablet (15 mg total) by mouth daily with supper. 11/19/13   Darlin Coco, MD   There were no vitals taken for this visit. Physical Exam  HENT:  Head:     Right Ear: External ear normal.  Left Ear: External ear normal.  Nose: Nose normal.  Mouth/Throat: Oropharynx is clear and moist.  Left upper face with abrasion. Significant swelling and ecchymosis to L periorbital area. Pt able to open eye on command.   L inner cheek with well-approximating 0.5cm superficial laceration near corner of mouth. Bleeding well-controlled.   L lower lip with area of erythema/edema that is ttp but no bite mark or laceration.   Eyes: Conjunctivae and EOM are normal. Pupils are equal, round, and reactive to light.  Neck: Normal range of motion. Neck supple.  No c-spine tenderness  Cardiovascular: Regular rhythm, normal heart sounds and intact distal pulses.  Tachycardia present.   Pulmonary/Chest: Effort normal and breath sounds normal. No accessory muscle usage. No respiratory distress. She has no wheezes. She exhibits no tenderness.    Abdominal: Soft. Bowel sounds are normal. She exhibits no distension. There is no tenderness. There is no rebound and no guarding.  Musculoskeletal: She exhibits no edema.  Hands with bilateral superficial abrasions along knuckles. R hand with tenderness at MTP.   5/5 grip strength, UE and LE strength to resistance  L knee markedly ttp. Bilateral knees with superficial abraisons and ecchymosis. L knee mildly edematous.   Neurological: She has normal strength. No cranial nerve deficit or sensory deficit.  Initially A&Ox4 but waxes and wanes throughout conversation  Skin: Skin is warm and dry.  Psychiatric: She has a normal mood and affect.  Nursing note and vitals reviewed.   ED Course  Procedures (including critical care time) Labs Review Labs Reviewed  BASIC METABOLIC PANEL - Abnormal; Notable for the following:    Glucose, Bld 106 (*)    GFR calc non Af Amer 55 (*)    All other components within normal limits  CBC WITH DIFFERENTIAL/PLATELET  PROTIME-INR  URINALYSIS, ROUTINE W REFLEX MICROSCOPIC (NOT AT [facility])   Randolm Idol, ED    Imaging Review Dg Chest 2 View  06/29/2015  CLINICAL DATA:  Fall onto hard surface.  Initial encounter. EXAM: CHEST  2 VIEW COMPARISON:  01/15/2014 FINDINGS: The posterior costophrenic sulci are in incompletely visualized in the lateral projection, a mild limitation. Aneurysmal thoracic aorta with distal arch to descending stent graft. Chronic cardiopericardial enlargement. Changes of left breast and axillary surgery. There is no edema, consolidation, visible effusion, or pneumothorax. No acute osseous finding. Stable appearance of enchondroma in the proximal right humerus. IMPRESSION: Stable exam.  No evidence of acute disease. Electronically Signed   By: Monte Fantasia M.D.   On: 06/29/2015 16:05   Ct Head Wo Contrast  06/29/2015  CLINICAL DATA:  Tripped over steps hitting left side of face.  Patient on blood thinners. Bruising about the left eye and cheek. EXAM: CT HEAD WITHOUT CONTRAST CT MAXILLOFACIAL WITHOUT CONTRAST CT CERVICAL SPINE WITHOUT CONTRAST TECHNIQUE: Multidetector CT imaging of the head, cervical spine, and maxillofacial structures were performed using the standard protocol without intravenous contrast. Multiplanar CT image reconstructions of the cervical spine and maxillofacial structures were also generated. COMPARISON:  Head and C-spine CT - 06/14/2015; head CT -06/29/2014 FINDINGS: CT HEAD FINDINGS Re- demonstrated scattered minimal periventricular hypodensities compatible microvascular ischemic disease, most conspicuous about the right centrum semiovale (image 18, series 3). The gray-white differentiation is otherwise well maintained without CT evidence of acute large territory infarct. No intraparenchymal or extra-axial mass or hemorrhage. Unchanged size and configuration of the ventricles and basilar cisterns. No midline shift. Limited visualization of the paranasal sinuses and mastoid air cells is normal. No displaced calvarial fracture. CT MAXILLOFACIAL  FINDINGS There is rather extensive soft tissue swelling about the left orbit extending to involve the superior lateral aspect of the left side of the face (representative images 49 - 70, series 5). These findings are without associated radiopaque foreign body or displaced facial fracture. Normal appearance of the bilateral pterygoid plates. Normal appearance of the bilateral zygomatic arches. No displaced nasal bone fracture. Normal noncontrast appearance of the bilateral orbits and globes. Normal appearance the mandible. The bilateral mandibular condyles normally located. Mild degenerative change of the left TMJ. The paranasal sinuses and mastoid air cells are normally aerated. No air-fluid levels. CT CERVICAL SPINE FINDINGS C1 to the superior endplate of T3 is imaged. Normal alignment of the cervical spine. No anterolisthesis or retrolisthesis. The bilateral facets are normally aligned. A peripherally corticated ossicle is noted about the posterior inferior aspect of the left C3 transverse facet (image 42, series 13). The dens is normally positioned and a lateral masses of C1. Moderate degenerative change of the atlantodental articulation. Normal atlantoaxial articulations. No fracture or static subluxation of the cervical spine. Cervical vertebral body heights are preserved. Prevertebral soft tissues are normal. Mild to moderate multilevel cervical spine DDD, worst C5-C6 with disc space height loss, endplate irregularity and small posteriorly directed disc osteophyte complex at this location. Limited visualization of the lung apices demonstrates the mid aspect of a thoracic aortic stent graft, incompletely evaluated. Biapical pleural parenchymal thickening. Calcified atherosclerotic plaque when the bilateral carotid bulbs. No bulky cervical lymphadenopathy on this noncontrast examination. Normal noncontrast appearance of the thyroid gland. IMPRESSION: 1. Diffuse soft tissue swelling about the left orbit and cheek  without associated radiopaque foreign body or displaced facial fracture. 2. Similar findings of micro ischemic disease without acute intracranial process. 3. No fracture or static subluxation of the cervical spine. 4. Mild-to-moderate multilevel cervical spine DDD, worst at C5-C6. Electronically Signed   By: Sandi Mariscal M.D.   On: 06/29/2015 16:57   Ct Cervical Spine Wo Contrast  06/29/2015  CLINICAL DATA:  Tripped over steps hitting left side of face. Patient on blood thinners. Bruising about the left eye and cheek. EXAM: CT HEAD WITHOUT CONTRAST CT MAXILLOFACIAL WITHOUT CONTRAST CT CERVICAL SPINE WITHOUT CONTRAST TECHNIQUE: Multidetector CT imaging of the head, cervical spine, and maxillofacial structures were performed using the standard protocol without intravenous contrast. Multiplanar CT image reconstructions of the cervical spine and maxillofacial structures were also generated. COMPARISON:  Head and C-spine CT - 06/14/2015; head CT -06/29/2014 FINDINGS: CT HEAD FINDINGS Re- demonstrated scattered minimal periventricular hypodensities compatible microvascular ischemic disease, most conspicuous about the right centrum semiovale (image 18, series 3). The gray-white  differentiation is otherwise well maintained without CT evidence of acute large territory infarct. No intraparenchymal or extra-axial mass or hemorrhage. Unchanged size and configuration of the ventricles and basilar cisterns. No midline shift. Limited visualization of the paranasal sinuses and mastoid air cells is normal. No displaced calvarial fracture. CT MAXILLOFACIAL FINDINGS There is rather extensive soft tissue swelling about the left orbit extending to involve the superior lateral aspect of the left side of the face (representative images 49 - 70, series 5). These findings are without associated radiopaque foreign body or displaced facial fracture. Normal appearance of the bilateral pterygoid plates. Normal appearance of the bilateral  zygomatic arches. No displaced nasal bone fracture. Normal noncontrast appearance of the bilateral orbits and globes. Normal appearance the mandible. The bilateral mandibular condyles normally located. Mild degenerative change of the left TMJ. The paranasal sinuses and mastoid air cells are normally aerated. No air-fluid levels. CT CERVICAL SPINE FINDINGS C1 to the superior endplate of T3 is imaged. Normal alignment of the cervical spine. No anterolisthesis or retrolisthesis. The bilateral facets are normally aligned. A peripherally corticated ossicle is noted about the posterior inferior aspect of the left C3 transverse facet (image 42, series 13). The dens is normally positioned and a lateral masses of C1. Moderate degenerative change of the atlantodental articulation. Normal atlantoaxial articulations. No fracture or static subluxation of the cervical spine. Cervical vertebral body heights are preserved. Prevertebral soft tissues are normal. Mild to moderate multilevel cervical spine DDD, worst C5-C6 with disc space height loss, endplate irregularity and small posteriorly directed disc osteophyte complex at this location. Limited visualization of the lung apices demonstrates the mid aspect of a thoracic aortic stent graft, incompletely evaluated. Biapical pleural parenchymal thickening. Calcified atherosclerotic plaque when the bilateral carotid bulbs. No bulky cervical lymphadenopathy on this noncontrast examination. Normal noncontrast appearance of the thyroid gland. IMPRESSION: 1. Diffuse soft tissue swelling about the left orbit and cheek without associated radiopaque foreign body or displaced facial fracture. 2. Similar findings of micro ischemic disease without acute intracranial process. 3. No fracture or static subluxation of the cervical spine. 4. Mild-to-moderate multilevel cervical spine DDD, worst at C5-C6. Electronically Signed   By: Sandi Mariscal M.D.   On: 06/29/2015 16:57   Dg Knee Complete 4  Views Left  06/29/2015  ADDENDUM REPORT: 06/29/2015 16:14 ADDENDUM: Left knee: Moderate tricompartmental degenerative changes with joint space narrowing and osteophytic spurring. No acute fractures identified. No definite joint effusion. IMPRESSION: Degenerative changes but no acute fracture. Electronically Signed   By: Marijo Sanes M.D.   On: 06/29/2015 16:14  06/29/2015  CLINICAL DATA:  [place] today and injured right hand. EXAM: RIGHT HAND - COMPLETE 3+ VIEW; LEFT KNEE - COMPLETE 4+ VIEW COMPARISON:  Wrist radiograph 12/21/2006 FINDINGS: Osteoporosis and degenerative changes but no acute fracture. IMPRESSION: No acute fracture. Electronically Signed: By: Marijo Sanes M.D. On: 06/29/2015 16:06   Dg Hand Complete Right  06/29/2015  ADDENDUM REPORT: 06/29/2015 16:14 ADDENDUM: Left knee: Moderate tricompartmental degenerative changes with joint space narrowing and osteophytic spurring. No acute fractures identified. No definite joint effusion. IMPRESSION: Degenerative changes but no acute fracture. Electronically Signed   By: Marijo Sanes M.D.   On: 06/29/2015 16:14  06/29/2015  CLINICAL DATA:  [place] today and injured right hand. EXAM: RIGHT HAND - COMPLETE 3+ VIEW; LEFT KNEE - COMPLETE 4+ VIEW COMPARISON:  Wrist radiograph 12/21/2006 FINDINGS: Osteoporosis and degenerative changes but no acute fracture. IMPRESSION: No acute fracture. Electronically Signed: By: Ricky Stabs.D.  On: 06/29/2015 16:06   Ct Maxillofacial Wo Cm  06/29/2015  CLINICAL DATA:  Tripped over steps hitting left side of face. Patient on blood thinners. Bruising about the left eye and cheek. EXAM: CT HEAD WITHOUT CONTRAST CT MAXILLOFACIAL WITHOUT CONTRAST CT CERVICAL SPINE WITHOUT CONTRAST TECHNIQUE: Multidetector CT imaging of the head, cervical spine, and maxillofacial structures were performed using the standard protocol without intravenous contrast. Multiplanar CT image reconstructions of the cervical spine and maxillofacial structures  were also generated. COMPARISON:  Head and C-spine CT - 06/14/2015; head CT -06/29/2014 FINDINGS: CT HEAD FINDINGS Re- demonstrated scattered minimal periventricular hypodensities compatible microvascular ischemic disease, most conspicuous about the right centrum semiovale (image 18, series 3). The gray-white differentiation is otherwise well maintained without CT evidence of acute large territory infarct. No intraparenchymal or extra-axial mass or hemorrhage. Unchanged size and configuration of the ventricles and basilar cisterns. No midline shift. Limited visualization of the paranasal sinuses and mastoid air cells is normal. No displaced calvarial fracture. CT MAXILLOFACIAL FINDINGS There is rather extensive soft tissue swelling about the left orbit extending to involve the superior lateral aspect of the left side of the face (representative images 49 - 70, series 5). These findings are without associated radiopaque foreign body or displaced facial fracture. Normal appearance of the bilateral pterygoid plates. Normal appearance of the bilateral zygomatic arches. No displaced nasal bone fracture. Normal noncontrast appearance of the bilateral orbits and globes. Normal appearance the mandible. The bilateral mandibular condyles normally located. Mild degenerative change of the left TMJ. The paranasal sinuses and mastoid air cells are normally aerated. No air-fluid levels. CT CERVICAL SPINE FINDINGS C1 to the superior endplate of T3 is imaged. Normal alignment of the cervical spine. No anterolisthesis or retrolisthesis. The bilateral facets are normally aligned. A peripherally corticated ossicle is noted about the posterior inferior aspect of the left C3 transverse facet (image 42, series 13). The dens is normally positioned and a lateral masses of C1. Moderate degenerative change of the atlantodental articulation. Normal atlantoaxial articulations. No fracture or static subluxation of the cervical spine. Cervical  vertebral body heights are preserved. Prevertebral soft tissues are normal. Mild to moderate multilevel cervical spine DDD, worst C5-C6 with disc space height loss, endplate irregularity and small posteriorly directed disc osteophyte complex at this location. Limited visualization of the lung apices demonstrates the mid aspect of a thoracic aortic stent graft, incompletely evaluated. Biapical pleural parenchymal thickening. Calcified atherosclerotic plaque when the bilateral carotid bulbs. No bulky cervical lymphadenopathy on this noncontrast examination. Normal noncontrast appearance of the thyroid gland. IMPRESSION: 1. Diffuse soft tissue swelling about the left orbit and cheek without associated radiopaque foreign body or displaced facial fracture. 2. Similar findings of micro ischemic disease without acute intracranial process. 3. No fracture or static subluxation of the cervical spine. 4. Mild-to-moderate multilevel cervical spine DDD, worst at C5-C6. Electronically Signed   By: Sandi Mariscal M.D.   On: 06/29/2015 16:57   I have personally reviewed and evaluated these images and lab results as part of my medical decision-making.   EKG Interpretation None      MDM   Final diagnoses:  Fall  Contusion of face, initial encounter  Contusion of left knee, initial encounter    Initial imaging negative. CT maxillofacial reveals diffuse soft tissue orbital and cheek swelling but no fracture or foreign bodies. Left knee shows chronic degenerative changes but no acute fracture. Given degree of left knee tenderness and swelling, will ambulate pt. If she cannot bear  weight will consider CT to r/o tibial plateau fracture.  On re-eval, pt's son is present. Pt continues to not remember where she was when she fell. He states that pt's mentation is at baseline and she has had issues with dementia for the past year, but it has particularly been worse the past month or so. Pt's son feels that pt is unsafe as she  lives at home by herself and he is her closest relative (pt's son lives in [place]). Pt is apparently still driving as well. Son would like to speak to social work about assisted living placement.   After discussion with social work, pt's son will stay with pt and discuss living situation. Pt was able to ambulate on left knee with mild pain. At this time, pt is at baseline mentation with negative workup. She is safe to be discharged home with assistance from her son. Family was given strict ER return precautions and instructed to follow up with PCP. Pt and family verbalized understanding.    Anne Ng, PA-C 06/29/15 2045  Sharlett Iles, MD 07/10/15 2147

## 2015-06-29 NOTE — Discharge Instructions (Signed)
You were seen in the emergency room after a fall. Your CT scans and X-rays were normal. You can expect some increase in swelling over the next couple of days. You may use ice on affected areas today and tomorrow to help the swelling I will give you a prescription for vicodin, a strong pain killer, to use as needed. If you do not need it you do not have to take it. You can always take over the counter Tylenol.  Please follow up with your primary care provider within one week. As we discussed, return to the emergency room for any new or worsening symptoms.

## 2015-09-25 ENCOUNTER — Encounter (HOSPITAL_COMMUNITY): Payer: Self-pay | Admitting: Emergency Medicine

## 2015-09-25 ENCOUNTER — Emergency Department (HOSPITAL_COMMUNITY): Payer: Medicare Other

## 2015-09-25 ENCOUNTER — Emergency Department (HOSPITAL_COMMUNITY)
Admission: EM | Admit: 2015-09-25 | Discharge: 2015-09-26 | Disposition: A | Discharge status: Other Acute Inpatient Hospital | Payer: Medicare Other | Attending: Emergency Medicine | Admitting: Emergency Medicine

## 2015-09-25 DIAGNOSIS — I5031 Acute diastolic (congestive) heart failure: Secondary | ICD-10-CM | POA: Insufficient documentation

## 2015-09-25 DIAGNOSIS — I10 Essential (primary) hypertension: Secondary | ICD-10-CM | POA: Diagnosis not present

## 2015-09-25 DIAGNOSIS — E079 Disorder of thyroid, unspecified: Secondary | ICD-10-CM | POA: Diagnosis not present

## 2015-09-25 DIAGNOSIS — Z87448 Personal history of other diseases of urinary system: Secondary | ICD-10-CM | POA: Insufficient documentation

## 2015-09-25 DIAGNOSIS — R079 Chest pain, unspecified: Secondary | ICD-10-CM | POA: Diagnosis present

## 2015-09-25 DIAGNOSIS — Z7982 Long term (current) use of aspirin: Secondary | ICD-10-CM | POA: Insufficient documentation

## 2015-09-25 DIAGNOSIS — T82330A Leakage of aortic (bifurcation) graft (replacement), initial encounter: Secondary | ICD-10-CM | POA: Insufficient documentation

## 2015-09-25 DIAGNOSIS — I4891 Unspecified atrial fibrillation: Secondary | ICD-10-CM | POA: Diagnosis not present

## 2015-09-25 DIAGNOSIS — Y712 Prosthetic and other implants, materials and accessory cardiovascular devices associated with adverse incidents: Secondary | ICD-10-CM | POA: Diagnosis not present

## 2015-09-25 DIAGNOSIS — Z853 Personal history of malignant neoplasm of breast: Secondary | ICD-10-CM | POA: Diagnosis not present

## 2015-09-25 DIAGNOSIS — Z87891 Personal history of nicotine dependence: Secondary | ICD-10-CM | POA: Insufficient documentation

## 2015-09-25 DIAGNOSIS — Z87440 Personal history of urinary (tract) infections: Secondary | ICD-10-CM | POA: Insufficient documentation

## 2015-09-25 DIAGNOSIS — Z79899 Other long term (current) drug therapy: Secondary | ICD-10-CM | POA: Diagnosis not present

## 2015-09-25 DIAGNOSIS — IMO0002 Reserved for concepts with insufficient information to code with codable children: Secondary | ICD-10-CM

## 2015-09-25 DIAGNOSIS — Z7901 Long term (current) use of anticoagulants: Secondary | ICD-10-CM | POA: Insufficient documentation

## 2015-09-25 LAB — COMPREHENSIVE METABOLIC PANEL
ALBUMIN: 3.3 g/dL — AB (ref 3.5–5.0)
ALK PHOS: 75 U/L (ref 38–126)
ALT: 11 U/L — ABNORMAL LOW (ref 14–54)
ANION GAP: 18 — AB (ref 5–15)
AST: 16 U/L (ref 15–41)
BILIRUBIN TOTAL: 1.3 mg/dL — AB (ref 0.3–1.2)
BUN: 20 mg/dL (ref 6–20)
CALCIUM: 9.3 mg/dL (ref 8.9–10.3)
CO2: 18 mmol/L — ABNORMAL LOW (ref 22–32)
Chloride: 100 mmol/L — ABNORMAL LOW (ref 101–111)
Creatinine, Ser: 1.21 mg/dL — ABNORMAL HIGH (ref 0.44–1.00)
GFR calc Af Amer: 49 mL/min — ABNORMAL LOW (ref >60)
GFR, EST NON AFRICAN AMERICAN: 42 mL/min — AB (ref >60)
GLUCOSE: 105 mg/dL — AB (ref 65–99)
POTASSIUM: 4.2 mmol/L (ref 3.5–5.1)
Sodium: 136 mmol/L (ref 135–145)
TOTAL PROTEIN: 7.2 g/dL (ref 6.5–8.1)

## 2015-09-25 LAB — I-STAT CHEM 8, ED
BUN: 22 mg/dL — ABNORMAL HIGH (ref 6–20)
CALCIUM ION: 1.15 mmol/L (ref 1.13–1.30)
Chloride: 102 mmol/L (ref 101–111)
Creatinine, Ser: 0.9 mg/dL (ref 0.44–1.00)
GLUCOSE: 104 mg/dL — AB (ref 65–99)
HCT: 49 % — ABNORMAL HIGH (ref 36.0–46.0)
HEMOGLOBIN: 16.7 g/dL — AB (ref 12.0–15.0)
Potassium: 4.1 mmol/L (ref 3.5–5.1)
SODIUM: 137 mmol/L (ref 135–145)
TCO2: 19 mmol/L (ref 0–100)

## 2015-09-25 LAB — CBC
HCT: 43.7 % (ref 36.0–46.0)
Hemoglobin: 14.3 g/dL (ref 12.0–15.0)
MCH: 29.8 pg (ref 26.0–34.0)
MCHC: 32.7 g/dL (ref 30.0–36.0)
MCV: 91 fL (ref 78.0–100.0)
PLATELETS: 172 10*3/uL (ref 150–400)
RBC: 4.8 MIL/uL (ref 3.87–5.11)
RDW: 15.3 % (ref 11.5–15.5)
WBC: 10.3 10*3/uL (ref 4.0–10.5)

## 2015-09-25 LAB — I-STAT TROPONIN, ED: TROPONIN I, POC: 0.02 ng/mL (ref 0.00–0.08)

## 2015-09-25 MED ORDER — MORPHINE SULFATE (PF) 4 MG/ML IV SOLN
4.0000 mg | Freq: Once | INTRAVENOUS | Status: AC
  Administered 2015-09-25: 4 mg via INTRAVENOUS
  Filled 2015-09-25: qty 1

## 2015-09-25 MED ORDER — METOPROLOL TARTRATE 1 MG/ML IV SOLN
10.0000 mg | Freq: Once | INTRAVENOUS | Status: AC
  Administered 2015-09-25: 10 mg via INTRAVENOUS
  Filled 2015-09-25: qty 10

## 2015-09-25 MED ORDER — IOPAMIDOL (ISOVUE-370) INJECTION 76%
INTRAVENOUS | Status: AC
  Administered 2015-09-25: 75 mL
  Filled 2015-09-25: qty 100

## 2015-09-25 MED ORDER — ACETAMINOPHEN 325 MG PO TABS: 650.0000 mg | ORAL_TABLET | Freq: Once | ORAL | Status: DC

## 2015-09-25 MED ORDER — HYDRALAZINE HCL 20 MG/ML IJ SOLN
10.0000 mg | Freq: Once | INTRAMUSCULAR | Status: AC
  Administered 2015-09-25: 10 mg via INTRAVENOUS
  Filled 2015-09-25: qty 1

## 2015-09-25 MED ORDER — NITROGLYCERIN IN D5W 200-5 MCG/ML-% IV SOLN
10.0000 ug/min | INTRAVENOUS | Status: DC
  Administered 2015-09-25: 10 ug/min via INTRAVENOUS
  Filled 2015-09-25: qty 250

## 2015-09-25 MED ORDER — DEXTROSE 5 % IV SOLN
0.5000 mg/min | INTRAVENOUS | Status: DC
  Administered 2015-09-25: 0.5 mg/min via INTRAVENOUS
  Filled 2015-09-25: qty 100

## 2015-09-25 MED ORDER — NITROGLYCERIN 0.4 MG SL SUBL
0.4000 mg | SUBLINGUAL_TABLET | SUBLINGUAL | Status: DC | PRN
  Administered 2015-09-25: 0.4 mg via SUBLINGUAL
  Filled 2015-09-25: qty 1

## 2015-09-25 MED ORDER — ONDANSETRON HCL 4 MG/2ML IJ SOLN
4.0000 mg | Freq: Once | INTRAMUSCULAR | Status: AC
  Administered 2015-09-25: 4 mg via INTRAVENOUS

## 2015-09-25 NOTE — ED Notes (Signed)
Patient requesting some tylenol for back pain.  MD aware.

## 2015-09-25 NOTE — ED Provider Notes (Signed)
CSN: [id number]     Arrival date & time 09/25/15  1849 History   First MD Initiated Contact with Patient 09/25/15 1857     Chief Complaint  Patient presents with  . Chest Pain     (Consider location/radiation/quality/duration/timing/severity/associated sxs/prior Treatment) The history is provided by the patient.  Beth [name] is a 80 y.o. female hx of afib, HTN, aortic aneurysm with dissection with a previous aortic graft, here with Chest pain. Chest pain and pressure started yesterday. It was left-sided no radiation. Patient denies any abdominal pain. Patient was given ASA 325 mg, 1 nitro and now has no pain. Patient has no known CAD and no cardiac stents. She is still taking xarelto for afib and had previous cardioversion and currently followed at [facility]. She had aortic graft done at [facility] but had a leak before and had a repair.       Past Medical History  Diagnosis Date  . Atrial fibrillation ([place])   . Hypertension   . Thyroid disease   . Aortic aneurysm with dissection ([place])   . Breast CA ([place])   . Acute renal failure (ARF) ([place])   . CHF (congestive heart failure) ([place])   . Aortic aneurysm, descending ([place])   . Acute diastolic heart failure, in combination with MR and atrial fib 12/11/2013  . UTI (urinary tract infection) 12/11/2013  . Hypomagnesemia, replaced 12/11/2013  . Lymphedema left arm   Past Surgical History  Procedure Laterality Date  . Ascending aortic aneurysm repair    . Descending aortic aneurysm repair    . Breast lumpectomy    . Abdominal hysterectomy    . Cardioversion N/A 12/10/2013    Procedure: CARDIOVERSION   (bedside);  Surgeon: Peter M Martinique, MD;  Location: [place];  Service: Cardiovascular;  Laterality: N/A;   No family history on file. Social History  Substance Use Topics  . Smoking status: Former Research scientist (life sciences)  . Smokeless tobacco: None  . Alcohol Use: No   OB History    No data available     Review of Systems  Cardiovascular: Positive for chest  pain.  All other systems reviewed and are negative.     Allergies  Review of patient's allergies indicates no known allergies.  Home Medications   Prior to Admission medications   Medication Sig Start Date End Date Taking? Authorizing Provider  acetaminophen (TYLENOL) 650 MG CR tablet Take 1,300 mg by mouth every 8 (eight) hours as needed for pain.   Yes Historical Provider, MD  aspirin 81 MG tablet Take 81 mg by mouth daily.   Yes Historical Provider, MD  celecoxib (CELEBREX) 200 MG capsule Take 200 mg by mouth daily as needed for mild pain.   Yes Historical Provider, MD  Cranberry-Vitamin C-Probiotic (AZO CRANBERRY PO) Take 1 tablet by mouth every morning.   Yes Historical Provider, MD  flecainide (TAMBOCOR) 100 MG tablet Take 50 mg by mouth 2 (two) times daily.    Yes Historical Provider, MD  furosemide (LASIX) 20 MG tablet Take 1 tablet (20 mg total) by mouth daily. 01/14/14  Yes Darlin Coco, MD  hydrALAZINE (APRESOLINE) 25 MG tablet Take 1 tablet (25 mg total) by mouth every 8 (eight) hours. 12/11/13  Yes Isaiah Serge, NP  HYDROcodone-acetaminophen (NORCO/VICODIN) 5-325 MG tablet Take 1 tablet by mouth every 6 (six) hours as needed. Patient taking differently: Take 1 tablet by mouth every 6 (six) hours as needed for moderate pain.  06/29/15  Yes Anne Ng, PA-C  hydrocortisone cream 1 % Apply 1 application topically 3 (three) times daily as needed for itching (skin irritation). 12/11/13  Yes Isaiah Serge, NP  levothyroxine (SYNTHROID, LEVOTHROID) 75 MCG tablet Take 75 mcg by mouth daily before breakfast.   Yes Historical Provider, MD  losartan (COZAAR) 100 MG tablet Take 100 mg by mouth daily.   Yes Historical Provider, MD  metoprolol succinate (TOPROL-XL) 25 MG 24 hr tablet Take 25 mg by mouth daily.   Yes Historical Provider, MD  Multiple Vitamins-Iron (MULTIVITAMIN/IRON PO) Take by mouth daily.   Yes Historical Provider, MD  nitroGLYCERIN (NITROSTAT) 0.4 MG SL tablet Place  0.4 mg under the tongue every 5 (five) minutes as needed for chest pain.   Yes Historical Provider, MD  pantoprazole (PROTONIX) 40 MG tablet Take 40 mg by mouth daily.   Yes Historical Provider, MD  piroxicam ([name]) 20 MG capsule Take 20 mg by mouth daily as needed (pain). To use sparingly   Yes Historical Provider, MD  Rivaroxaban (XARELTO) 15 MG TABS tablet Take 1 tablet (15 mg total) by mouth daily with supper. 11/19/13  Yes Darlin Coco, MD   BP 135/92 mmHg  Pulse 101  Temp(Src) 98 F (36.7 C) (Oral)  Resp 26  Ht 5\' 5"  (1.651 m)  Wt 145 lb (65.772 kg)  BMI 24.13 kg/m2  SpO2 94% Physical Exam  Constitutional: She is oriented to person, place, and time. She appears well-developed and well-nourished.  HENT:  Head: Normocephalic.  Mouth/Throat: Oropharynx is clear and moist.  Eyes: Conjunctivae are normal. Pupils are equal, round, and reactive to light.  Neck: Normal range of motion. Neck supple.  Cardiovascular: Normal rate, regular rhythm and normal heart sounds.   Pulmonary/Chest: Effort normal and breath sounds normal. No respiratory distress. She has no wheezes. She has no rales.  Abdominal: Soft. Bowel sounds are normal. She exhibits no distension. There is no tenderness.  Musculoskeletal: Normal range of motion.  Good peripheral pulses   Neurological: She is alert and oriented to person, place, and time. No cranial nerve deficit. Coordination normal.  Skin: Skin is warm and dry.  Psychiatric: She has a normal mood and affect. Her behavior is normal. Judgment and thought content normal.  Nursing note and vitals reviewed.   ED Course  Procedures (including critical care time)  CRITICAL CARE Performed by: Darl Householder, Deklan Minar   Total critical care time: 30 minutes  Critical care time was exclusive of separately billable procedures and treating other patients.  Critical care was necessary to treat or prevent imminent or life-threatening deterioration.  Critical care was  time spent personally by me on the following activities: development of treatment plan with patient and/or surrogate as well as nursing, discussions with consultants, evaluation of patient's response to treatment, examination of patient, obtaining history from patient or surrogate, ordering and performing treatments and interventions, ordering and review of laboratory studies, ordering and review of radiographic studies, pulse oximetry and re-evaluation of patient's condition.  Labs Review Labs Reviewed  COMPREHENSIVE METABOLIC PANEL - Abnormal; Notable for the following:    Chloride 100 (*)    CO2 18 (*)    Glucose, Bld 105 (*)    Creatinine, Ser 1.21 (*)    Albumin 3.3 (*)    ALT 11 (*)    Total Bilirubin 1.3 (*)    GFR calc non Af Amer 42 (*)    GFR calc Af Amer 49 (*)    Anion gap 18 (*)    All other components  within normal limits  I-STAT CHEM 8, ED - Abnormal; Notable for the following:    BUN 22 (*)    Glucose, Bld 104 (*)    Hemoglobin 16.7 (*)    HCT 49.0 (*)    All other components within normal limits  CBC  I-STAT TROPOININ, ED    Imaging Review Dg Chest 2 View  09/25/2015  CLINICAL DATA:  Chest pain EXAM: CHEST  2 VIEW COMPARISON:  06/29/2015 FINDINGS: Cardiomediastinal silhouette is stable. Again noted aneurysmal thoracic aorta with stent in descending aorta. No infiltrate or pleural effusion. No pulmonary edema. Status post median sternotomy. Stable mild degenerative changes thoracic spine. IMPRESSION: Stable postsurgical changes and aneurysmal dilatation of thoracic aorta. No active disease. Status post median sternotomy. Electronically Signed   By: Lahoma Crocker M.D.   On: 09/25/2015 19:31   Ct Angio Chest Pe W/cm &/or Wo Cm  09/25/2015  CLINICAL DATA:  Chest pain. History of prior aortic dissection and thoracic endograft placement. EXAM: CT ANGIOGRAPHY CHEST WITH CONTRAST TECHNIQUE: Multidetector CT imaging of the chest was performed using the standard protocol during bolus  administration of intravenous contrast. Multiplanar CT image reconstructions and MIPs were obtained to evaluate the vascular anatomy. CONTRAST:  75 mL Isovue 370 IV COMPARISON:  Prior CTA of the chest, abdomen and pelvis at [facility] on 04/20/2014. FINDINGS: A thoracic aortic endograft is again identified extending from the distal aortic arch into the mid descending thoracic aorta. Ascending aortic graft also present without valve replacement. Great vessels have been reimplanted into the aortic graft and are normally patent. No evidence of dissection at the level of the proximal aortic graft. There is progression of a very prominent endoleak which can be seen emanating from the distal and of the endograft consistent with a type 1b endoleak. There is considerable enlargement of the aneurysmal sac since prior imaging with maximal diameter of approximately 6.5- 6.7 cm. The dilated aneurysm sac exerts mass effect on the carina and lower trachea as well as left-sided pulmonary arteries. The native aorta distal to the endograft also shows significant enlargement since the prior study. Just below the endograft the aorta measures 5.6 cm in diameter. The aorta measures 5.1 x 5.2 cm at the diaphragmatic hiatus. No overt aortic rupture is seen. There is some pleural thickening present along the medial and posterior hemithorax. No evidence of pulmonary infiltrate or edema. Visualized upper abdomen shows hepatic cysts and calcified gallstone. Review of the MIP images confirms the above findings. IMPRESSION: 1. There is evidence of substantial enlargement of thoracic aortic aneurysmal disease due to a prominent type 1b endoleak along the distal and of the endograft with prominent perfusion of the sac surrounding the endograft. Maximal aortic diameter is now approximately 6.7 cm. There also is progressive enlargement of native aneurysmal disease of the lower descending thoracic aorta extending into the upper abdominal  aorta compared to the prior study. 2. No evidence of component of aortic dissection with stable evidence of a proximal ascending aortic graft and reimplanted great vessels. 3. No overt evidence of aortic rupture. However, if the patient is acutely symptomatic, given significant expansion of the thoracic aneurysmal sac, there is a potential risk for impending rupture. Emergent vascular surgical consultation is recommended. 4. These results were called by telephone at the time of interpretation on 09/25/2015 at 10:21 pm to Dr. Shirlyn Goltz , who verbally acknowledged these results. Electronically Signed   By: Aletta Edouard M.D.   On: 09/25/2015 22:29  I have personally reviewed and evaluated these images and lab results as part of my medical decision-making.   EKG Interpretation   Date/Time:  Monday September 25 2015 18:50:37 EDT Ventricular Rate:  105 PR Interval:    QRS Duration: 97 QT Interval:  354 QTC Calculation: 468 R Axis:   85 Text Interpretation:  Atrial fibrillation Ventricular premature complex  Borderline right axis deviation Probable left ventricular hypertrophy No  significant change since last tracing Confirmed by Jisela Merlino  MD, Nevin Grizzle ([id number])  on 09/25/2015 7:20:00 PM      MDM   Final diagnoses:  None    Beth [name] is a 80 y.o. female here with chest pain. Consider ACS vs aortic graft leak. She is on xarelto and I think PE is unlikely. Will get CT angio to r/o graft leak, labs, trop x 2.   10 pm Patient had sudden worsening chest pain. BP down to 140s. Given nitro SL but didn't help with pain. Started nitro drip. Called CT to get CT faster.   10:30 pm CT showed endograft leak. Consulted Dr. Roxy Horseman from [place] [facility], who recommend transfer to [facility] since her surgery was done there. BP 130s on nitro drip. Will switch to labetalol drip given endoleak.   11:25 PM Called [facility] and discussed with Dr. Berneice Gandy who knows the patient well. Has hx of endoleak that they have been  following. Given uncontrolled BP and slightly larger leak, will transfer to [facility] ED to be seen by CT surgery.    Wandra Arthurs, MD 09/25/15 [phone]

## 2015-09-25 NOTE — ED Notes (Signed)
Patient transported to CT 

## 2015-09-25 NOTE — ED Notes (Signed)
Onset today developed chest pressure 1745 EMS called administered 324 mg aspirin and 1 nitro SL. Pain upon arrival 1/10 achy dull states feels better.

## 2015-09-26 DIAGNOSIS — T82330A Leakage of aortic (bifurcation) graft (replacement), initial encounter: Secondary | ICD-10-CM | POA: Diagnosis not present

## 2015-09-26 NOTE — ED Notes (Signed)
[facility] arrived and packaging pt for transport to [place].

## 2015-10-03 ENCOUNTER — Non-Acute Institutional Stay (SKILLED_NURSING_FACILITY): Payer: Medicare Other | Admitting: Internal Medicine

## 2015-10-03 DIAGNOSIS — R0789 Other chest pain: Secondary | ICD-10-CM | POA: Diagnosis not present

## 2015-10-03 DIAGNOSIS — E038 Other specified hypothyroidism: Secondary | ICD-10-CM

## 2015-10-03 DIAGNOSIS — E034 Atrophy of thyroid (acquired): Secondary | ICD-10-CM | POA: Diagnosis not present

## 2015-10-03 DIAGNOSIS — I5032 Chronic diastolic (congestive) heart failure: Secondary | ICD-10-CM

## 2015-10-03 DIAGNOSIS — I482 Chronic atrial fibrillation, unspecified: Secondary | ICD-10-CM

## 2015-10-03 DIAGNOSIS — K219 Gastro-esophageal reflux disease without esophagitis: Secondary | ICD-10-CM

## 2015-10-03 DIAGNOSIS — I7101 Dissection of ascending aorta: Secondary | ICD-10-CM

## 2015-10-03 DIAGNOSIS — F0390 Unspecified dementia without behavioral disturbance: Secondary | ICD-10-CM

## 2015-10-07 ENCOUNTER — Encounter: Payer: Self-pay | Admitting: Internal Medicine

## 2015-10-07 DIAGNOSIS — I7101 Dissection of ascending aorta: Secondary | ICD-10-CM | POA: Insufficient documentation

## 2015-10-07 DIAGNOSIS — R079 Chest pain, unspecified: Secondary | ICD-10-CM | POA: Insufficient documentation

## 2015-10-07 DIAGNOSIS — I482 Chronic atrial fibrillation, unspecified: Secondary | ICD-10-CM | POA: Insufficient documentation

## 2015-10-07 DIAGNOSIS — K219 Gastro-esophageal reflux disease without esophagitis: Secondary | ICD-10-CM | POA: Insufficient documentation

## 2015-10-07 DIAGNOSIS — F0390 Unspecified dementia without behavioral disturbance: Secondary | ICD-10-CM | POA: Insufficient documentation

## 2015-10-07 NOTE — Progress Notes (Addendum)
MRN: [id number] Name: Beth [name]  Sex: female Age: 80 y.o. DOB: Dec 28, 1935  Max #:  Facility/Room:[facility] Level Of Care: SNF Provider: Inocencio Homes D Emergency Contacts: Extended Emergency Contact Information Primary Emergency Contact: Haynes Dage [place] of [place] Phone: [phone] Relation: Son  Code Status:   Allergies: Review of patient's allergies indicates no known allergies.  Chief Complaint  Patient presents with  . New Admit To SNF    HPI: Patient is 80 y.o. female who presented to OSH with ant CP, tx to [facility], where she was admitted from 4/4-10  after CTA showed a endoleak in her distal thoracic aorta endograft and enlargement of native descending thoracic aortic anuerysm. Pt was treated with bblockers and not considered surgical candidate. Chronic xarelto was held. Pt is admitted to SNF with generalized weakness for OT/PT. While at SNF pt will be followed for AF, tx with flecanide,ASA, sCHF, tx with cozaar, lasiz and labatolol, and hypothyroisism, tx with synthroid.  Past Medical History  Diagnosis Date  . Atrial fibrillation ([place])   . Hypertension   . Thyroid disease   . Aortic aneurysm with dissection ([place])   . Breast CA ([place])   . Acute renal failure (ARF) ([place])   . CHF (congestive heart failure) ([place])   . Aortic aneurysm, descending ([place])   . Acute diastolic heart failure, in combination with MR and atrial fib 12/11/2013  . UTI (urinary tract infection) 12/11/2013  . Hypomagnesemia, replaced 12/11/2013  . Lymphedema left arm    Past Surgical History  Procedure Laterality Date  . Ascending aortic aneurysm repair    . Descending aortic aneurysm repair    . Breast lumpectomy    . Abdominal hysterectomy    . Cardioversion N/A 12/10/2013    Procedure: CARDIOVERSION   (bedside);  Surgeon: Peter M Martinique, MD;  Location: [place];  Service: Cardiovascular;  Laterality: N/A;      Medication List       This list is  accurate as of: 10/03/15 11:59 PM.  Always use your most recent med list.               acetaminophen 500 MG tablet  Commonly known as:  TYLENOL  Take 500 mg by mouth every 6 (six) hours as needed.     aspirin 81 MG tablet  Take 81 mg by mouth daily.     cetirizine 10 MG tablet  Commonly known as:  ZYRTEC  Take 10 mg by mouth daily.     flecainide 100 MG tablet  Commonly known as:  TAMBOCOR  Take 50 mg by mouth 2 (two) times daily.     fluticasone 50 MCG/ACT nasal spray  Commonly known as:  FLONASE  Place 1 spray into both nostrils daily.     furosemide 20 MG tablet  Commonly known as:  LASIX  Take 1 tablet (20 mg total) by mouth daily.     labetalol 300 MG tablet  Commonly known as:  NORMODYNE  Take 150 mg by mouth every 12 (twelve) hours.     levothyroxine 75 MCG tablet  Commonly known as:  SYNTHROID, LEVOTHROID  Take 75 mcg by mouth daily before breakfast.     losartan 25 MG tablet  Commonly known as:  COZAAR  Take 25 mg by mouth daily.     MULTIVITAMIN/IRON PO  Take by mouth daily.     Oxycodone HCl 10 MG Tabs  Take  10 mg by mouth every 4 (four) hours as needed.     pantoprazole 40 MG tablet  Commonly known as:  PROTONIX  Take 40 mg by mouth daily.     senna-docusate 8.6-50 MG tablet  Commonly known as:  Senokot-S  Take 2 tablets by mouth at bedtime.        Meds ordered this encounter  Medications  . labetalol (NORMODYNE) 300 MG tablet    Sig: Take 150 mg by mouth every 12 (twelve) hours.  . Oxycodone HCl 10 MG TABS    Sig: Take 10 mg by mouth every 4 (four) hours as needed.  . senna-docusate (SENOKOT-S) 8.6-50 MG tablet    Sig: Take 2 tablets by mouth at bedtime.  Marland Kitchen acetaminophen (TYLENOL) 500 MG tablet    Sig: Take 500 mg by mouth every 6 (six) hours as needed.  . cetirizine (ZYRTEC) 10 MG tablet    Sig: Take 10 mg by mouth daily.  . fluticasone (FLONASE) 50 MCG/ACT nasal spray    Sig: Place 1 spray into both nostrils daily.  Marland Kitchen losartan  (COZAAR) 25 MG tablet    Sig: Take 25 mg by mouth daily.    Immunization History  Administered Date(s) Administered  . Tdap 06/29/2015    Social History  Substance Use Topics  . Smoking status: Former Research scientist (life sciences)  . Smokeless tobacco: Not on file  . Alcohol Use: No    Family history is + HD   Review of Systems  DATA OBTAINED: from patient, nurse GENERAL:  no fevers, fatigue, appetite changes SKIN: No itching, rash or wounds EYES: No eye pain, redness, discharge EARS: No earache, tinnitus, change in hearing NOSE: No congestion, drainage or bleeding  MOUTH/THROAT: No mouth or tooth pain, No sore throat RESPIRATORY: No cough, wheezing, SOB CARDIAC: No chest pain, palpitations, lower extremity edema  GI: No abdominal pain, No N/V/D or constipation, No heartburn or reflux  GU: No dysuria, frequency or urgency, or incontinence  MUSCULOSKELETAL: No unrelieved bone/joint pain NEUROLOGIC: No headache, dizziness or focal weakness PSYCHIATRIC: No c/o anxiety or sadness   Filed Vitals:   10/07/15 1830  BP: 137/81  Pulse: 74  Temp: 97.9 F (36.6 C)  Resp: 20    SpO2 Readings from Last 1 Encounters:  09/26/15 93%        Physical Exam  GENERAL APPEARANCE: Alert, conversant,  No acute distress.  SKIN: No diaphoresis rash HEAD: Normocephalic, atraumatic  EYES: Conjunctiva/lids clear. Pupils round, reactive. EOMs intact.  EARS: External exam WNL, canals clear. Hearing grossly normal.  NOSE: No deformity or discharge.  MOUTH/THROAT: Lips w/o lesions  RESPIRATORY: Breathing is even, unlabored. Lung sounds are clear   CARDIOVASCULAR: Heart irreg 2/murmur, no rubs or gallops. trace peripheral edema.   GASTROINTESTINAL: Abdomen is soft, non-tender, not distended w/ normal bowel sounds. GENITOURINARY: Bladder non tender, not distended  MUSCULOSKELETAL: No abnormal joints or musculature NEUROLOGIC:  Cranial nerves 2-12 grossly intact. Moves all extremities  PSYCHIATRIC: Mood and  affect appropriate to situation with dementia, no behavioral issues  Patient Active Problem List   Diagnosis Date Noted  . Type 1 dissection of thoracic aorta ([place]) 10/07/2015  . Chest pain 10/07/2015  . Chronic atrial fibrillation ([place]) 10/07/2015  . Dementia without behavioral disturbance 10/07/2015  . GERD (gastroesophageal reflux disease) 10/07/2015  . Acute diastolic heart failure, in combination with MR and atrial fib 12/11/2013  . Chronic diastolic heart failure ([place]) 12/11/2013  . On continuous oral anticoagulation, Xarelto 12/11/2013  . UTI (  urinary tract infection) 12/11/2013  . Hypomagnesemia, replaced 12/11/2013  . Mitral regurgitation 11/17/2013  . Normocytic anemia 11/17/2013  . Atrial fibrillation, 12/10/13 DCCV maintaining SR  10/20/2013  . DOE (dyspnea on exertion) 10/20/2013  . Benign hypertensive heart disease without heart failure 10/20/2013  . Thoracic ascending aortic aneurysm ([place]) 10/20/2013  . Descending thoracic aortic aneurysm ([place]) 10/20/2013  . Malaise and fatigue 10/20/2013  . Hypothyroid 10/20/2013    CBC    Component Value Date/Time   WBC 10.3 09/25/2015 2014   RBC 4.80 09/25/2015 2014   HGB 16.7* 09/25/2015 2026   HCT 49.0* 09/25/2015 2026   PLT 172 09/25/2015 2014   MCV 91.0 09/25/2015 2014   LYMPHSABS 0.7 06/29/2015 1620   MONOABS 0.7 06/29/2015 1620   EOSABS 0.1 06/29/2015 1620   BASOSABS 0.0 06/29/2015 1620    CMP     Component Value Date/Time   NA 137 09/25/2015 2026   K 4.1 09/25/2015 2026   CL 102 09/25/2015 2026   CO2 18* 09/25/2015 2014   GLUCOSE 104* 09/25/2015 2026   BUN 22* 09/25/2015 2026   CREATININE 0.90 09/25/2015 2026   CALCIUM 9.3 09/25/2015 2014   PROT 7.2 09/25/2015 2014   ALBUMIN 3.3* 09/25/2015 2014   AST 16 09/25/2015 2014   ALT 11* 09/25/2015 2014   ALKPHOS 75 09/25/2015 2014   BILITOT 1.3* 09/25/2015 2014   GFRNONAA 42* 09/25/2015 2014   GFRAA 49* 09/25/2015 2014    No results found for: HGBA1C    Dg Chest 2 View  09/25/2015  CLINICAL DATA:  Chest pain EXAM: CHEST  2 VIEW COMPARISON:  06/29/2015 FINDINGS: Cardiomediastinal silhouette is stable. Again noted aneurysmal thoracic aorta with stent in descending aorta. No infiltrate or pleural effusion. No pulmonary edema. Status post median sternotomy. Stable mild degenerative changes thoracic spine. IMPRESSION: Stable postsurgical changes and aneurysmal dilatation of thoracic aorta. No active disease. Status post median sternotomy. Electronically Signed   By: Lahoma Crocker M.D.   On: 09/25/2015 19:31   Ct Angio Chest Pe W/cm &/or Wo Cm  09/25/2015  CLINICAL DATA:  Chest pain. History of prior aortic dissection and thoracic endograft placement. EXAM: CT ANGIOGRAPHY CHEST WITH CONTRAST TECHNIQUE: Multidetector CT imaging of the chest was performed using the standard protocol during bolus administration of intravenous contrast. Multiplanar CT image reconstructions and MIPs were obtained to evaluate the vascular anatomy. CONTRAST:  75 mL Isovue 370 IV COMPARISON:  Prior CTA of the chest, abdomen and pelvis at [facility] on 04/20/2014. FINDINGS: A thoracic aortic endograft is again identified extending from the distal aortic arch into the mid descending thoracic aorta. Ascending aortic graft also present without valve replacement. Great vessels have been reimplanted into the aortic graft and are normally patent. No evidence of dissection at the level of the proximal aortic graft. There is progression of a very prominent endoleak which can be seen emanating from the distal and of the endograft consistent with a type 1b endoleak. There is considerable enlargement of the aneurysmal sac since prior imaging with maximal diameter of approximately 6.5- 6.7 cm. The dilated aneurysm sac exerts mass effect on the carina and lower trachea as well as left-sided pulmonary arteries. The native aorta distal to the endograft also shows significant enlargement since the  prior study. Just below the endograft the aorta measures 5.6 cm in diameter. The aorta measures 5.1 x 5.2 cm at the diaphragmatic hiatus. No overt aortic rupture is seen. There is some pleural thickening present along  the medial and posterior hemithorax. No evidence of pulmonary infiltrate or edema. Visualized upper abdomen shows hepatic cysts and calcified gallstone. Review of the MIP images confirms the above findings. IMPRESSION: 1. There is evidence of substantial enlargement of thoracic aortic aneurysmal disease due to a prominent type 1b endoleak along the distal and of the endograft with prominent perfusion of the sac surrounding the endograft. Maximal aortic diameter is now approximately 6.7 cm. There also is progressive enlargement of native aneurysmal disease of the lower descending thoracic aorta extending into the upper abdominal aorta compared to the prior study. 2. No evidence of component of aortic dissection with stable evidence of a proximal ascending aortic graft and reimplanted great vessels. 3. No overt evidence of aortic rupture. However, if the patient is acutely symptomatic, given significant expansion of the thoracic aneurysmal sac, there is a potential risk for impending rupture. Emergent vascular surgical consultation is recommended. 4. These results were called by telephone at the time of interpretation on 09/25/2015 at 10:21 pm to Dr. Shirlyn Goltz , who verbally acknowledged these results. Electronically Signed   By: Aletta Edouard M.D.   On: 09/25/2015 22:29    Not all labs, radiology exams or other studies done during hospitalization come through on my EPIC note; however they are reviewed by me.    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 but that was NOT in EPIC; SNF -  keep SBP < 120 with labaetolol and cozaar; will need to inc cozaar; q4 BP checks and f/u with [facility] in [facility]  Chest pain SNF - no  reported ECG changes, will cont tx with oxycodone  Chronic atrial fibrillation (HCC) SNF - cont flecainide and ASA 81 mg, xarelto being held  Chronic diastolic heart failure SNF - no signs of overload; cont cozaar, labatolol and lasix  Hypothyroid SNF - not stated as uncontrolled; cont synthroid 75 mcg daily  Dementia without behavioral disturbance SNF - reported mentation waxes and wanes, not worsened by oxycodone;will monitor  GERD (gastroesophageal reflux disease) SNF - not stated as uncontrolled; cont protonix 40 mg daily   Time spent > 45 min;> 50% of time with patient was spent reviewing records, labs, tests and studies, counseling and developing plan of care  Hennie Duos, MD

## 2015-10-07 NOTE — Assessment & Plan Note (Signed)
SNF - not stated as uncontrolled; cont synthroid 75 mcg daily

## 2015-10-07 NOTE — Assessment & Plan Note (Signed)
SNF - reported mentation waxes and wanes, not worsened by oxycodone;will monitor

## 2015-10-07 NOTE — Assessment & Plan Note (Signed)
SNF - not stated as uncontrolled; cont protonix 40 mg daily

## 2015-10-07 NOTE — Assessment & Plan Note (Signed)
SNF - cont flecainide and ASA 81 mg, xarelto being held

## 2015-10-07 NOTE — Assessment & Plan Note (Signed)
SNF - no signs of overload; cont cozaar, labatolol and lasix

## 2015-10-07 NOTE — Assessment & Plan Note (Addendum)
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

## 2015-10-07 NOTE — Assessment & Plan Note (Signed)
SNF - no reported ECG changes, will cont tx with oxycodone

## 2015-10-10 ENCOUNTER — Non-Acute Institutional Stay (SKILLED_NURSING_FACILITY): Payer: Medicare Other | Admitting: Internal Medicine

## 2015-10-10 DIAGNOSIS — I7101 Dissection of ascending aorta: Secondary | ICD-10-CM

## 2015-10-13 ENCOUNTER — Non-Acute Institutional Stay (SKILLED_NURSING_FACILITY): Payer: Medicare Other | Admitting: Internal Medicine

## 2015-10-13 ENCOUNTER — Encounter: Payer: Self-pay | Admitting: Internal Medicine

## 2015-10-13 DIAGNOSIS — R0609 Other forms of dyspnea: Secondary | ICD-10-CM

## 2015-10-13 DIAGNOSIS — I7101 Dissection of ascending aorta: Secondary | ICD-10-CM

## 2015-10-13 DIAGNOSIS — I5032 Chronic diastolic (congestive) heart failure: Secondary | ICD-10-CM | POA: Diagnosis not present

## 2015-10-13 DIAGNOSIS — F0390 Unspecified dementia without behavioral disturbance: Secondary | ICD-10-CM

## 2015-10-13 DIAGNOSIS — I712 Thoracic aortic aneurysm, without rupture: Secondary | ICD-10-CM | POA: Diagnosis not present

## 2015-10-13 DIAGNOSIS — I482 Chronic atrial fibrillation, unspecified: Secondary | ICD-10-CM

## 2015-10-13 DIAGNOSIS — E038 Other specified hypothyroidism: Secondary | ICD-10-CM | POA: Diagnosis not present

## 2015-10-13 DIAGNOSIS — I7121 Aneurysm of the ascending aorta, without rupture: Secondary | ICD-10-CM

## 2015-10-13 DIAGNOSIS — E034 Atrophy of thyroid (acquired): Secondary | ICD-10-CM | POA: Diagnosis not present

## 2015-10-13 DIAGNOSIS — K219 Gastro-esophageal reflux disease without esophagitis: Secondary | ICD-10-CM | POA: Diagnosis not present

## 2015-10-13 NOTE — Progress Notes (Signed)
MRN: [id number] Name: Beth [name]  Sex: female Age: 80 y.o. DOB: 1936/01/31  [place] #: Karren Burly Facility/Room:100 Level Of Care: SNF Provider: Inocencio Homes D Emergency Contacts: Extended Emergency Contact Information Primary Emergency Contact: Haynes Dage [place] Phone: [phone] Relation: Son  Code Status:   Allergies: Review of patient's allergies indicates no known allergies.  Chief Complaint  Patient presents with  . Discharge Note    HPI: Patient is 80 y.o. female who presented to OSH with ant CP, tx to [facility], where she was admitted from 4/4-10 after CTA showed a endoleak in her distal thoracic aorta endograft and enlargement of native descending thoracic aortic anuerysm. Pt was treated with bblockers and not considered surgical candidate. Chronic xarelto was held. Pt was admitted to SNF with generalized weakness for OT/PT. Pt is now ready to be discharged to home.  Past Medical History  Diagnosis Date  . Atrial fibrillation ([place])   . Hypertension   . Thyroid disease   . Aortic aneurysm with dissection ([place])   . Breast CA ([place])   . Acute renal failure (ARF) ([place])   . CHF (congestive heart failure) ([place])   . Aortic aneurysm, descending ([place])   . Acute diastolic heart failure, in combination with MR and atrial fib 12/11/2013  . UTI (urinary tract infection) 12/11/2013  . Hypomagnesemia, replaced 12/11/2013  . Lymphedema left arm    Past Surgical History  Procedure Laterality Date  . Ascending aortic aneurysm repair    . Descending aortic aneurysm repair    . Breast lumpectomy    . Abdominal hysterectomy    . Cardioversion N/A 12/10/2013    Procedure: CARDIOVERSION   (bedside);  Surgeon: Peter M Martinique, MD;  Location: [place];  Service: Cardiovascular;  Laterality: N/A;      Medication List       This list is accurate as of: 10/13/15  2:07 PM.  Always use your most recent med list.               acetaminophen  500 MG tablet  Commonly known as:  TYLENOL  Take 500 mg by mouth every 6 (six) hours as needed.     aspirin 81 MG tablet  Take 81 mg by mouth daily.     cetirizine 10 MG tablet  Commonly known as:  ZYRTEC  Take 10 mg by mouth daily.     flecainide 100 MG tablet  Commonly known as:  TAMBOCOR  Take 50 mg by mouth 2 (two) times daily.     fluticasone 50 MCG/ACT nasal spray  Commonly known as:  FLONASE  Place 1 spray into both nostrils daily.     furosemide 20 MG tablet  Commonly known as:  LASIX  Take 1 tablet (20 mg total) by mouth daily.     labetalol 300 MG tablet  Commonly known as:  NORMODYNE  Take 150 mg by mouth every 12 (twelve) hours.     levothyroxine 75 MCG tablet  Commonly known as:  SYNTHROID, LEVOTHROID  Take 75 mcg by mouth daily before breakfast.     losartan 50 MG tablet  Commonly known as:  COZAAR  Take 50 mg by mouth daily.     MULTIVITAMIN/IRON PO  Take by mouth daily.     oxycodone 5 MG capsule  Commonly known as:  OXY-IR  Take 5 mg by mouth every 6 (six) hours as needed.  pantoprazole 40 MG tablet  Commonly known as:  PROTONIX  Take 40 mg by mouth daily.     senna-docusate 8.6-50 MG tablet  Commonly known as:  Senokot-S  Take 2 tablets by mouth at bedtime.        Meds ordered this encounter  Medications  . losartan (COZAAR) 50 MG tablet    Sig: Take 50 mg by mouth daily.  Marland Kitchen oxycodone (OXY-IR) 5 MG capsule    Sig: Take 5 mg by mouth every 6 (six) hours as needed.    Immunization History  Administered Date(s) Administered  . Tdap 06/29/2015    Social History  Substance Use Topics  . Smoking status: Former Research scientist (life sciences)  . Smokeless tobacco: Not on file  . Alcohol Use: No    Filed Vitals:   10/13/15 1347  BP: 117/49  Pulse: 75  Temp: 97.2 F (36.2 C)  Resp: 16    Physical Exam  GENERAL APPEARANCE: Alert, conversant. No acute distress.  HEENT: Unremarkable. RESPIRATORY: Breathing is even, unlabored. Lung sounds are clear    CARDIOVASCULAR: Heart RRR no murmurs, rubs or gallops. No peripheral edema.  GASTROINTESTINAL: Abdomen is soft, non-tender, not distended w/ normal bowel sounds.  NEUROLOGIC: Cranial nerves 2-12 grossly intact. Moves all extremities  Patient Active Problem List   Diagnosis Date Noted  . Type 1 dissection of thoracic aorta ([place]) 10/07/2015  . Chest pain 10/07/2015  . Chronic atrial fibrillation ([place]) 10/07/2015  . Dementia without behavioral disturbance 10/07/2015  . GERD (gastroesophageal reflux disease) 10/07/2015  . Acute diastolic heart failure, in combination with MR and atrial fib 12/11/2013  . Chronic diastolic heart failure ([place]) 12/11/2013  . On continuous oral anticoagulation, Xarelto 12/11/2013  . UTI (urinary tract infection) 12/11/2013  . Hypomagnesemia, replaced 12/11/2013  . Mitral regurgitation 11/17/2013  . Normocytic anemia 11/17/2013  . Atrial fibrillation, 12/10/13 DCCV maintaining SR  10/20/2013  . DOE (dyspnea on exertion) 10/20/2013  . Benign hypertensive heart disease without heart failure 10/20/2013  . Thoracic ascending aortic aneurysm ([place]) 10/20/2013  . Descending thoracic aortic aneurysm ([place]) 10/20/2013  . Malaise and fatigue 10/20/2013  . Hypothyroid 10/20/2013    CBC    Component Value Date/Time   WBC 10.3 09/25/2015 2014   RBC 4.80 09/25/2015 2014   HGB 16.7* 09/25/2015 2026   HCT 49.0* 09/25/2015 2026   PLT 172 09/25/2015 2014   MCV 91.0 09/25/2015 2014   LYMPHSABS 0.7 06/29/2015 1620   MONOABS 0.7 06/29/2015 1620   EOSABS 0.1 06/29/2015 1620   BASOSABS 0.0 06/29/2015 1620    CMP     Component Value Date/Time   NA 137 09/25/2015 2026   K 4.1 09/25/2015 2026   CL 102 09/25/2015 2026   CO2 18* 09/25/2015 2014   GLUCOSE 104* 09/25/2015 2026   BUN 22* 09/25/2015 2026   CREATININE 0.90 09/25/2015 2026   CALCIUM 9.3 09/25/2015 2014   PROT 7.2 09/25/2015 2014   ALBUMIN 3.3* 09/25/2015 2014   AST 16 09/25/2015 2014   ALT 11*  09/25/2015 2014   ALKPHOS 75 09/25/2015 2014   BILITOT 1.3* 09/25/2015 2014   GFRNONAA 42* 09/25/2015 2014   GFRAA 49* 09/25/2015 2014    Assessment and Plan  Pt is d/c to home with HH/OT/PT/nursing. Medications have been reconciled and Rx's written.   Time spent > 30 min;> 50% of time with patient was spent reviewing records, labs, tests and studies, counseling and developing plan of care  Hennie Duos, MD

## 2015-10-14 ENCOUNTER — Encounter: Payer: Self-pay | Admitting: Internal Medicine

## 2015-10-14 NOTE — Progress Notes (Signed)
MRN: [id number] Name: Beth [name]  Sex: female Age: 80 y.o. DOB: [birth year]  [place] #: [facility] Facility/Room:100 Level Of Care: SNF Provider: Inocencio Homes D Emergency Contacts: Extended Emergency Contact Information Primary Emergency Contact: Haynes Dage [place] Phone: [phone] Relation: Son  Code Status:   Allergies: Review of patient's allergies indicates no known allergies.  Chief Complaint  Patient presents with  . Acute Visit    HPI: Patient is 80 y.o. 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 Pt was treated with bblockers and not considered surgical candidate. Plan was to keep Pt's SBP < 120; pt's SBP has ranged from 149 to 91 and has consistently been < 130. Pt denies CP or other sx.  Past Medical History  Diagnosis Date  . Atrial fibrillation ([place])   . Hypertension   . Thyroid disease   . Aortic aneurysm with dissection ([place])   . Breast CA ([place])   . Acute renal failure (ARF) ([place])   . CHF (congestive heart failure) ([place])   . Aortic aneurysm, descending ([place])   . Acute diastolic heart failure, in combination with MR and atrial fib 12/11/2013  . UTI (urinary tract infection) 12/11/2013  . Hypomagnesemia, replaced 12/11/2013  . Lymphedema left arm    Past Surgical History  Procedure Laterality Date  . Ascending aortic aneurysm repair    . Descending aortic aneurysm repair    . Breast lumpectomy    . Abdominal hysterectomy    . Cardioversion N/A 12/10/2013    Procedure: CARDIOVERSION   (bedside);  Surgeon: Peter M Martinique, MD;  Location: [facility];  Service: Cardiovascular;  Laterality: N/A;      Medication List       This list is accurate as of: 10/10/15 11:59 PM.  Always use your most recent med list.               acetaminophen 500 MG tablet  Commonly known as:  TYLENOL   Take 500 mg by mouth every 6 (six) hours as needed.     aspirin 81 MG tablet  Take 81 mg by mouth daily.     cetirizine 10 MG tablet  Commonly known as:  ZYRTEC  Take 10 mg by mouth daily.     flecainide 100 MG tablet  Commonly known as:  TAMBOCOR  Take 50 mg by mouth 2 (two) times daily.     fluticasone 50 MCG/ACT nasal spray  Commonly known as:  FLONASE  Place 1 spray into both nostrils daily.     furosemide 20 MG tablet  Commonly known as:  LASIX  Take 1 tablet (20 mg total) by mouth daily.     labetalol 300 MG tablet  Commonly known as:  NORMODYNE  Take 150 mg by mouth every 12 (twelve) hours.     levothyroxine 75 MCG tablet  Commonly known as:  SYNTHROID, LEVOTHROID  Take 75 mcg by mouth daily before breakfast.     MULTIVITAMIN/IRON PO  Take by mouth daily.     pantoprazole 40 MG tablet  Commonly known as:  PROTONIX  Take 40 mg by mouth daily.     senna-docusate 8.6-50 MG tablet  Commonly known as:  Senokot-S  Take 2 tablets by mouth at bedtime.        No orders of  the defined types were placed in this encounter.    Immunization History  Administered Date(s) Administered  . Tdap 06/29/2015    Social History  Substance Use Topics  . Smoking status: Former Research scientist (life sciences)  . Smokeless tobacco: Not on file  . Alcohol Use: No    Review of Systems  DATA OBTAINED: from patient, nurse, medical record, family member GENERAL:  no fevers, fatigue, appetite changes SKIN: No itching, rash HEENT: No complaint RESPIRATORY: No cough, wheezing, SOB CARDIAC: No chest pain, palpitations, lower extremity edema  GI: No abdominal pain, No N/V/D or constipation, No heartburn or reflux  GU: No dysuria, frequency or urgency, or incontinence  MUSCULOSKELETAL: No unrelieved bone/joint pain NEUROLOGIC: No headache, dizziness  PSYCHIATRIC: No overt anxiety or sadness  Filed Vitals:   10/14/15 2027  BP: 135/69  Pulse: 80  Temp: 97.9 F (36.6 C)  Resp: 20    Physical  Exam  GENERAL APPEARANCE: Alert, conversant, No acute distress  SKIN: No diaphoresis rash, or wounds HEENT: Unremarkable RESPIRATORY: Breathing is even, unlabored. Lung sounds are clear   CARDIOVASCULAR: Heart RRR no murmurs, rubs or gallops. No peripheral edema  GASTROINTESTINAL: Abdomen is soft, non-tender, not distended w/ normal bowel sounds.  GENITOURINARY: Bladder non tender, not distended  MUSCULOSKELETAL: No abnormal joints or musculature NEUROLOGIC: Cranial nerves 2-12 grossly intact. Moves all extremities PSYCHIATRIC: Mood and affect appropriate to situation, no behavioral issues  Patient Active Problem List   Diagnosis Date Noted  . Type 1 dissection of thoracic aorta ([place]) 10/07/2015  . Chest pain 10/07/2015  . Chronic atrial fibrillation ([place]) 10/07/2015  . Dementia without behavioral disturbance 10/07/2015  . GERD (gastroesophageal reflux disease) 10/07/2015  . Acute diastolic heart failure, in combination with MR and atrial fib 12/11/2013  . Chronic diastolic heart failure ([place]) 12/11/2013  . On continuous oral anticoagulation, Xarelto 12/11/2013  . UTI (urinary tract infection) 12/11/2013  . Hypomagnesemia, replaced 12/11/2013  . Mitral regurgitation 11/17/2013  . Normocytic anemia 11/17/2013  . Atrial fibrillation, 12/10/13 DCCV maintaining SR  10/20/2013  . DOE (dyspnea on exertion) 10/20/2013  . Benign hypertensive heart disease without heart failure 10/20/2013  . Thoracic ascending aortic aneurysm ([place]) 10/20/2013  . Descending thoracic aortic aneurysm ([place]) 10/20/2013  . Malaise and fatigue 10/20/2013  . Hypothyroid 10/20/2013    CBC    Component Value Date/Time   WBC 10.3 09/25/2015 2014   RBC 4.80 09/25/2015 2014   HGB 16.7* 09/25/2015 2026   HCT 49.0* 09/25/2015 2026   PLT 172 09/25/2015 2014   MCV 91.0 09/25/2015 2014   LYMPHSABS 0.7 06/29/2015 1620   MONOABS 0.7 06/29/2015 1620   EOSABS 0.1 06/29/2015 1620   BASOSABS 0.0 06/29/2015 1620     CMP     Component Value Date/Time   NA 137 09/25/2015 2026   K 4.1 09/25/2015 2026   CL 102 09/25/2015 2026   CO2 18* 09/25/2015 2014   GLUCOSE 104* 09/25/2015 2026   BUN 22* 09/25/2015 2026   CREATININE 0.90 09/25/2015 2026   CALCIUM 9.3 09/25/2015 2014   PROT 7.2 09/25/2015 2014   ALBUMIN 3.3* 09/25/2015 2014   AST 16 09/25/2015 2014   ALT 11* 09/25/2015 2014   ALKPHOS 75 09/25/2015 2014   BILITOT 1.3* 09/25/2015 2014   GFRNONAA 42* 09/25/2015 2014   GFRAA 49* 09/25/2015 2014    Assessment and Plan  ENDOLEAK OF ENDOGRAFT OF THORACIC AORTA - CTS has wanted pt's SBP < 120 to control pt's leak medically;  pt's SBP  has not been consistently below 120; I want to drop it but not too much so have increased cozaar from 25 mg to 50 mg daily and will monitor.  Time spent > 25 min;> 50% of time with patient was spent reviewing records, labs, tests and studies, counseling and developing plan of care  Hennie Duos, MD

## 2015-10-16 ENCOUNTER — Encounter (HOSPITAL_COMMUNITY): Payer: Self-pay | Admitting: Emergency Medicine

## 2015-10-16 ENCOUNTER — Emergency Department (HOSPITAL_COMMUNITY)
Admission: EM | Admit: 2015-10-16 | Discharge: 2015-10-16 | Disposition: A | Discharge status: Home / Self Care | Payer: Medicare Other | Attending: Emergency Medicine | Admitting: Emergency Medicine

## 2015-10-16 ENCOUNTER — Emergency Department (HOSPITAL_COMMUNITY): Payer: Medicare Other

## 2015-10-16 DIAGNOSIS — Z853 Personal history of malignant neoplasm of breast: Secondary | ICD-10-CM | POA: Insufficient documentation

## 2015-10-16 DIAGNOSIS — I4891 Unspecified atrial fibrillation: Secondary | ICD-10-CM | POA: Diagnosis not present

## 2015-10-16 DIAGNOSIS — Z87440 Personal history of urinary (tract) infections: Secondary | ICD-10-CM | POA: Insufficient documentation

## 2015-10-16 DIAGNOSIS — Z7951 Long term (current) use of inhaled steroids: Secondary | ICD-10-CM | POA: Insufficient documentation

## 2015-10-16 DIAGNOSIS — I10 Essential (primary) hypertension: Secondary | ICD-10-CM | POA: Diagnosis not present

## 2015-10-16 DIAGNOSIS — E079 Disorder of thyroid, unspecified: Secondary | ICD-10-CM | POA: Diagnosis not present

## 2015-10-16 DIAGNOSIS — I5031 Acute diastolic (congestive) heart failure: Secondary | ICD-10-CM | POA: Insufficient documentation

## 2015-10-16 DIAGNOSIS — Z7982 Long term (current) use of aspirin: Secondary | ICD-10-CM | POA: Diagnosis not present

## 2015-10-16 DIAGNOSIS — Z79899 Other long term (current) drug therapy: Secondary | ICD-10-CM | POA: Insufficient documentation

## 2015-10-16 DIAGNOSIS — Z87448 Personal history of other diseases of urinary system: Secondary | ICD-10-CM | POA: Insufficient documentation

## 2015-10-16 DIAGNOSIS — R079 Chest pain, unspecified: Secondary | ICD-10-CM | POA: Diagnosis not present

## 2015-10-16 DIAGNOSIS — Z9889 Other specified postprocedural states: Secondary | ICD-10-CM | POA: Insufficient documentation

## 2015-10-16 DIAGNOSIS — Z87891 Personal history of nicotine dependence: Secondary | ICD-10-CM | POA: Diagnosis not present

## 2015-10-16 DIAGNOSIS — Z8679 Personal history of other diseases of the circulatory system: Secondary | ICD-10-CM

## 2015-10-16 LAB — I-STAT TROPONIN, ED: Troponin i, poc: 0 ng/mL (ref 0.00–0.08)

## 2015-10-16 LAB — CBC
HEMATOCRIT: 34.1 % — AB (ref 36.0–46.0)
HEMOGLOBIN: 11 g/dL — AB (ref 12.0–15.0)
MCH: 29.3 pg (ref 26.0–34.0)
MCHC: 32.3 g/dL (ref 30.0–36.0)
MCV: 90.7 fL (ref 78.0–100.0)
Platelets: 150 10*3/uL (ref 150–400)
RBC: 3.76 MIL/uL — ABNORMAL LOW (ref 3.87–5.11)
RDW: 15.6 % — ABNORMAL HIGH (ref 11.5–15.5)
WBC: 7 10*3/uL (ref 4.0–10.5)

## 2015-10-16 LAB — BASIC METABOLIC PANEL
ANION GAP: 12 (ref 5–15)
BUN: 24 mg/dL — ABNORMAL HIGH (ref 6–20)
CO2: 23 mmol/L (ref 22–32)
Calcium: 8.9 mg/dL (ref 8.9–10.3)
Chloride: 99 mmol/L — ABNORMAL LOW (ref 101–111)
Creatinine, Ser: 1.4 mg/dL — ABNORMAL HIGH (ref 0.44–1.00)
GFR calc Af Amer: 41 mL/min — ABNORMAL LOW (ref >60)
GFR calc non Af Amer: 35 mL/min — ABNORMAL LOW (ref >60)
GLUCOSE: 98 mg/dL (ref 65–99)
POTASSIUM: 3.9 mmol/L (ref 3.5–5.1)
Sodium: 134 mmol/L — ABNORMAL LOW (ref 135–145)

## 2015-10-16 LAB — PROTIME-INR
INR: 1.11 (ref 0.00–1.49)
Prothrombin Time: 14.5 seconds (ref 11.6–15.2)

## 2015-10-16 LAB — TROPONIN I

## 2015-10-16 MED ORDER — IOPAMIDOL (ISOVUE-370) INJECTION 76%
INTRAVENOUS | Status: AC
  Filled 2015-10-16: qty 100

## 2015-10-16 MED ORDER — FUROSEMIDE 20 MG PO TABS
20.0000 mg | ORAL_TABLET | Freq: Every day | ORAL | Status: DC
  Administered 2015-10-16: 20 mg via ORAL
  Filled 2015-10-16: qty 1

## 2015-10-16 MED ORDER — LOSARTAN POTASSIUM 50 MG PO TABS
50.0000 mg | ORAL_TABLET | Freq: Every day | ORAL | Status: DC
  Administered 2015-10-16: 50 mg via ORAL
  Filled 2015-10-16: qty 1

## 2015-10-16 MED ORDER — IOPAMIDOL (ISOVUE-370) INJECTION 76%
INTRAVENOUS | Status: AC
  Administered 2015-10-16: 100 mL via INTRAVENOUS
  Filled 2015-10-16: qty 100

## 2015-10-16 MED ORDER — FLECAINIDE ACETATE 100 MG PO TABS
100.0000 mg | ORAL_TABLET | Freq: Once | ORAL | Status: AC
  Administered 2015-10-16: 100 mg via ORAL
  Filled 2015-10-16: qty 1

## 2015-10-16 MED ORDER — LABETALOL HCL 300 MG PO TABS
150.0000 mg | ORAL_TABLET | Freq: Once | ORAL | Status: AC
  Administered 2015-10-16: 150 mg via ORAL
  Filled 2015-10-16: qty 0.5

## 2015-10-16 NOTE — ED Notes (Signed)
MD at bedside. 

## 2015-10-16 NOTE — ED Notes (Signed)
Patient transported to CT 

## 2015-10-16 NOTE — ED Notes (Signed)
[facility] to start the transfer process. Spoke w/ Fritz Pickerel. He stated the floor does not have any available beds. I then transferred Fritz Pickerel to Dr. Johnney Killian to discuss the further plan.

## 2015-10-16 NOTE — ED Notes (Signed)
MD at bedside speaking with family 

## 2015-10-16 NOTE — ED Notes (Signed)
Patient transported to X-ray 

## 2015-10-16 NOTE — ED Notes (Signed)
PA at bedside.

## 2015-10-16 NOTE — Discharge Instructions (Signed)

## 2015-10-16 NOTE — ED Notes (Signed)
Pt in EMS from [facility] C/O CP since 0330. No other complaints. CP free since being in EMS truck. Given 324 ASA. At rehab for thoracic AA

## 2015-10-16 NOTE — ED Notes (Addendum)
Family at bedside requesting to speak with MD when available.   MD and PA aware.

## 2015-10-16 NOTE — ED Notes (Signed)
Ok to eat and drink per PA.

## 2015-10-16 NOTE — ED Provider Notes (Signed)
CSN: [id number]     Arrival date & time 10/16/15  0449 History   First MD Initiated Contact with Patient 10/16/15 [phone]     Chief Complaint  Patient presents with  . 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 No known CAD. The pain started last night, is located under her left breast. It is intermittent and achy. She was here on 09/25/15 when she was diagnosed with a graft leak. She was transferred to [facility] since her initial aortic aneurysm graft surgery was done there. Recommendations at the time were to keep BP under 140/70 since she is at risk for rupture. Treatment options were given which included medical management, open surgical repair and endovascular aortic repair by Dr. Sammuel Hines at [facility]. In the meantime she has been staying at [facility]. Denies fevers, chills, SOB, cough, wheezing, back pain, abdominal pain, blood in urine or stool. Currently denying chest pain to me. She was given 325 ASA by EMS.          Patient is a 80 y.o. female presenting with chest pain.  Chest Pain Associated symptoms: no abdominal pain, no cough, no fever, no nausea, no shortness of breath and not vomiting     Past Medical History  Diagnosis Date  . Atrial fibrillation ([place])   . Hypertension   . Thyroid disease   . Aortic aneurysm with dissection ([place])   . Breast CA ([place])   . Acute renal failure (ARF) ([place])   . CHF (congestive heart failure) ([place])   . Aortic aneurysm, descending ([place])   . Acute diastolic heart failure, in combination with MR and atrial fib 12/11/2013  . UTI (urinary tract infection) 12/11/2013  . Hypomagnesemia, replaced 12/11/2013  . Lymphedema left arm   Past Surgical History  Procedure Laterality Date  . Ascending aortic aneurysm  repair    . Descending aortic aneurysm repair    . Breast lumpectomy    . Abdominal hysterectomy    . Cardioversion N/A 12/10/2013    Procedure: CARDIOVERSION   (bedside);  Surgeon: Peter M Martinique, MD;  Location: [facility];  Service: Cardiovascular;  Laterality: N/A;   No family history on file. Social History  Substance Use Topics  . Smoking status: Former Research scientist (life sciences)  . Smokeless tobacco: None  . Alcohol Use: No   OB History    No data available     Review of Systems  Constitutional: Negative for fever and chills.  Respiratory: Negative for cough, chest tightness, shortness of breath and wheezing.   Cardiovascular: Positive for chest pain.  Gastrointestinal: Negative for nausea, vomiting, abdominal pain, diarrhea and blood in stool.  All other systems reviewed and are negative.   Allergies  Review of patient's allergies indicates no known allergies.  Home Medications   Prior to Admission medications   Medication Sig Start Date End Date Taking? Authorizing Provider  acetaminophen (TYLENOL) 500 MG tablet Take 500 mg by mouth every 6 (six) hours as needed for mild pain.    Yes Historical Provider, MD  aspirin EC 81 MG tablet Take 81 mg by mouth daily.   Yes Historical Provider, MD  bisacodyl (DULCOLAX) 10 MG suppository Place 10 mg rectally as needed for moderate constipation.   Yes Historical Provider, MD  cetirizine (ZYRTEC) 10  MG tablet Take 10 mg by mouth as needed for allergies.    Yes Historical Provider, MD  flecainide (TAMBOCOR) 100 MG tablet Take 50 mg by mouth 2 (two) times daily.    Yes Historical Provider, MD  fluticasone (FLONASE) 50 MCG/ACT nasal spray Place 1 spray into both nostrils as needed for allergies.    Yes Historical Provider, MD  furosemide (LASIX) 20 MG tablet Take 1 tablet (20 mg total) by mouth daily. 01/14/14  Yes Darlin Coco, MD  labetalol (NORMODYNE) 300 MG tablet Take 150 mg by mouth every 12 (twelve) hours.   Yes Historical Provider, MD   levothyroxine (SYNTHROID, LEVOTHROID) 75 MCG tablet Take 75 mcg by mouth daily before breakfast.   Yes Historical Provider, MD  losartan (COZAAR) 50 MG tablet Take 50 mg by mouth daily.   Yes Historical Provider, MD  magnesium hydroxide (MILK OF MAGNESIA) 400 MG/5ML suspension Take 30 mLs by mouth daily as needed for mild constipation.   Yes Historical Provider, MD  Multiple Vitamin (MULTIVITAMIN WITH MINERALS) TABS tablet Take 1 tablet by mouth daily.   Yes Historical Provider, MD  oxyCODONE (OXY IR/ROXICODONE) 5 MG immediate release tablet Take 5 mg by mouth every 6 (six) hours as needed for severe pain.   Yes Historical Provider, MD  pantoprazole (PROTONIX) 40 MG tablet Take 40 mg by mouth daily.   Yes Historical Provider, MD  senna-docusate (SENOKOT-S) 8.6-50 MG tablet Take 2 tablets by mouth at bedtime.   Yes Historical Provider, MD  Sodium Phosphates (RA SALINE ENEMA) 19-7 GM/118ML ENEM Place 1 each rectally as needed (for constipation).   Yes Historical Provider, MD   BP 120/72 mmHg  Pulse 76  Resp 18  SpO2 96%   Physical Exam  Constitutional: She is oriented to person, place, and time. She appears well-developed and well-nourished. No distress.  HENT:  Head: Normocephalic and atraumatic.  Hard of hearing  Eyes: Conjunctivae are normal. Pupils are equal, round, and reactive to light. Right eye exhibits no discharge. Left eye exhibits no discharge. No scleral icterus.  Neck: Normal range of motion. No JVD present.  Cardiovascular: Intact distal pulses.  An irregularly irregular rhythm present. Exam reveals no gallop and no friction rub.   No murmur heard. Pulses:      Carotid pulses are 2+ on the right side, and 2+ on the left side.      Radial pulses are 2+ on the right side, and 2+ on the left side.       Dorsalis pedis pulses are 2+ on the right side, and 2+ on the left side.  Pulmonary/Chest: Effort normal. No respiratory distress. She has no wheezes. She has no rales. She  exhibits no tenderness.  Chest pain is not reproducible with palpation  Abdominal: Soft. She exhibits no distension and no mass. There is no tenderness. There is no rebound and no guarding.  Musculoskeletal: She exhibits no edema.  No lower leg edema  Neurological: She is alert and oriented to person, place, and time.  Skin: Skin is warm and dry.  Psychiatric: She has a normal mood and affect.    ED Course  Procedures (including critical care time) Labs Review Labs Reviewed  BASIC METABOLIC PANEL - Abnormal; Notable for the following:    Sodium 134 (*)    Chloride 99 (*)    BUN 24 (*)    Creatinine, Ser 1.40 (*)    GFR calc non Af Amer 35 (*)    GFR calc Af  Amer 41 (*)    All other components within normal limits  CBC - Abnormal; Notable for the following:    RBC 3.76 (*)    Hemoglobin 11.0 (*)    HCT 34.1 (*)    RDW 15.6 (*)    All other components within normal limits  PROTIME-INR  TROPONIN I  Randolm Idol, ED    Imaging Review Dg Chest 2 View  10/16/2015  CLINICAL DATA:  Chest pain EXAM: CHEST  2 VIEW COMPARISON:  09/25/2015 FINDINGS: Chronic cardiomegaly. Aortic aneurysm status post stenting. Type 1 endoleak by recent CTA. No change in mediastinal widening. No failure or pneumonia. No effusion or pneumothorax. IMPRESSION: 1. Stable since prior.  No acute finding. 2. Aortic aneurysm with known endoleak, reference chest CTA 09/25/2015. Electronically Signed   By: Monte Fantasia M.D.   On: 10/16/2015 05:18   Ct Angio Chest Aorta W/cm &/or Wo/cm  10/16/2015  CLINICAL DATA:  Acute left-sided chest pain. History of thoracic aortic aneurysm. EXAM: CT ANGIOGRAPHY CHEST WITH CONTRAST TECHNIQUE: Multidetector CT imaging of the chest was performed using the standard protocol during bolus administration of intravenous contrast. Multiplanar CT image reconstructions and MIPs were obtained to evaluate the vascular anatomy. CONTRAST:  75 mL of Isovue 370 intravenously. COMPARISON:  CT  scan of September 25, 2015. FINDINGS: No pneumothorax or pleural effusion is noted. No acute pulmonary disease is noted. Visualized pulmonary arteries appear grossly normal. Visualized portion of upper abdomen is unremarkable. The patient is status post endograft placement extending from proximal portion of transverse aortic arch into distal descending thoracic aorta. There is been surgical reimplantation of the great vessels into the ascending thoracic aorta. The ascending thoracic aorta has maximum measured diameter 3.5 cm. The great vessels are widely patent without significant stenosis. The aneurysmal sac involving the transverse aortic arch and proximal descending thoracic aorta has maximum measured diameter 6.6 cm which is not significantly changed compared to prior exam. The distal descending thoracic aorta measures 5.7 cm in diameter. This is below the distal end of the stent graft. There remain large type 1 endoleak around the distal end of the stent graft which does not appear to be significantly changed compared to prior exam. No definite new dissection is noted. Review of the MIP images confirms the above findings. IMPRESSION: Status post endograft placement extending from proximal portion of transverse aortic arch into distal descending thoracic aorta with surgical reimplantation of great vessels into ascending thoracic aorta. There is no evidence of dissection or aneurysmal dilatation involving the ascending thoracic aorta. There remains severe enlargement of aneurysmal sac involving the distal transverse aortic arch and proximal descending thoracic aorta, which currently measures 6.6 cm and is not significantly changed compared to prior exam. Stable large type 1 endoleak is seen around the distal end of the stent graft within the distal descending thoracic aorta which also does not appear to be significantly changed compared to prior exam. Electronically Signed   By: Marijo Conception, M.D.   On: 10/16/2015  09:32   I have personally reviewed and evaluated these images and lab results as part of my medical decision-making.   EKG Interpretation   Date/Time:  Monday October 16 2015 05:17:52 EDT Ventricular Rate:  68 PR Interval:    QRS Duration: 110 QT Interval:  414 QTC Calculation: 440 R Axis:   55 Text Interpretation:  Atrial fibrillation Anterior infarct, old Since last  tracing rate slower Confirmed by LITTLE MD, RACHEL [phone]) on 10/16/2015  6:27:03  AM      MDM   Final diagnoses:  Chest pain, unspecified chest pain type  H/O thoracic aortic aneurysm repair   80 year old female who presents with chest pain for 1 day.  CT of chest was obtained to see if there was any change in the known graft leak. CT was negative for significant change since last imaging.   Troponin was neg. EKG was rate controlled A. Fib. BMP shows worsening kidney function - possibly contrast induced.  CBC shows anemia which she has a history of. Denies blood in stool, LOC/dizziness. She is not currently anticoagulated.  Pt's family requesting to be transferred to [facility]. Shared visit with Dr. Colvin Caroli who spoke with Dr. Lunette Stands. He feels comfortable with her going home as they would not do any additional testing at [facility]. Family informed.  Patient is NAD with stable VS. Morning BP meds given here in the ED.     Recardo Evangelist, PA-C 10/16/15 1554  Charlesetta Shanks, MD 10/19/15 [phone]

## 2016-04-05 ENCOUNTER — Other Ambulatory Visit: Payer: Self-pay | Admitting: Internal Medicine

## 2016-05-21 ENCOUNTER — Encounter (HOSPITAL_COMMUNITY): Payer: Self-pay | Admitting: Emergency Medicine

## 2016-05-21 ENCOUNTER — Inpatient Hospital Stay (HOSPITAL_COMMUNITY)
Admission: EM | Admit: 2016-05-21 | Discharge: 2016-06-24 | DRG: 189 | Disposition: E | Payer: Medicare Other | Attending: Internal Medicine | Admitting: Internal Medicine

## 2016-05-21 ENCOUNTER — Other Ambulatory Visit: Payer: Self-pay

## 2016-05-21 ENCOUNTER — Emergency Department (HOSPITAL_COMMUNITY): Payer: Medicare Other

## 2016-05-21 DIAGNOSIS — I5031 Acute diastolic (congestive) heart failure: Secondary | ICD-10-CM

## 2016-05-21 DIAGNOSIS — I10 Essential (primary) hypertension: Secondary | ICD-10-CM | POA: Diagnosis not present

## 2016-05-21 DIAGNOSIS — Z853 Personal history of malignant neoplasm of breast: Secondary | ICD-10-CM | POA: Diagnosis not present

## 2016-05-21 DIAGNOSIS — E875 Hyperkalemia: Secondary | ICD-10-CM | POA: Diagnosis present

## 2016-05-21 DIAGNOSIS — J398 Other specified diseases of upper respiratory tract: Secondary | ICD-10-CM | POA: Diagnosis present

## 2016-05-21 DIAGNOSIS — J441 Chronic obstructive pulmonary disease with (acute) exacerbation: Secondary | ICD-10-CM | POA: Diagnosis present

## 2016-05-21 DIAGNOSIS — Z515 Encounter for palliative care: Secondary | ICD-10-CM | POA: Diagnosis present

## 2016-05-21 DIAGNOSIS — I482 Chronic atrial fibrillation, unspecified: Secondary | ICD-10-CM | POA: Diagnosis present

## 2016-05-21 DIAGNOSIS — J9601 Acute respiratory failure with hypoxia: Secondary | ICD-10-CM | POA: Diagnosis present

## 2016-05-21 DIAGNOSIS — I340 Nonrheumatic mitral (valve) insufficiency: Secondary | ICD-10-CM | POA: Diagnosis present

## 2016-05-21 DIAGNOSIS — E039 Hypothyroidism, unspecified: Secondary | ICD-10-CM | POA: Diagnosis present

## 2016-05-21 DIAGNOSIS — K219 Gastro-esophageal reflux disease without esophagitis: Secondary | ICD-10-CM | POA: Diagnosis present

## 2016-05-21 DIAGNOSIS — J9621 Acute and chronic respiratory failure with hypoxia: Principal | ICD-10-CM | POA: Diagnosis present

## 2016-05-21 DIAGNOSIS — I712 Thoracic aortic aneurysm, without rupture: Secondary | ICD-10-CM | POA: Diagnosis present

## 2016-05-21 DIAGNOSIS — F0390 Unspecified dementia without behavioral disturbance: Secondary | ICD-10-CM | POA: Diagnosis present

## 2016-05-21 DIAGNOSIS — I5033 Acute on chronic diastolic (congestive) heart failure: Secondary | ICD-10-CM | POA: Diagnosis not present

## 2016-05-21 DIAGNOSIS — R0602 Shortness of breath: Secondary | ICD-10-CM | POA: Diagnosis present

## 2016-05-21 DIAGNOSIS — D649 Anemia, unspecified: Secondary | ICD-10-CM | POA: Diagnosis present

## 2016-05-21 DIAGNOSIS — Z8249 Family history of ischemic heart disease and other diseases of the circulatory system: Secondary | ICD-10-CM

## 2016-05-21 DIAGNOSIS — Z7982 Long term (current) use of aspirin: Secondary | ICD-10-CM

## 2016-05-21 DIAGNOSIS — R061 Stridor: Secondary | ICD-10-CM | POA: Diagnosis present

## 2016-05-21 DIAGNOSIS — Z87891 Personal history of nicotine dependence: Secondary | ICD-10-CM

## 2016-05-21 DIAGNOSIS — Z79899 Other long term (current) drug therapy: Secondary | ICD-10-CM

## 2016-05-21 DIAGNOSIS — Z66 Do not resuscitate: Secondary | ICD-10-CM | POA: Diagnosis present

## 2016-05-21 DIAGNOSIS — E871 Hypo-osmolality and hyponatremia: Secondary | ICD-10-CM | POA: Diagnosis present

## 2016-05-21 DIAGNOSIS — E222 Syndrome of inappropriate secretion of antidiuretic hormone: Secondary | ICD-10-CM | POA: Diagnosis present

## 2016-05-21 DIAGNOSIS — I5043 Acute on chronic combined systolic (congestive) and diastolic (congestive) heart failure: Secondary | ICD-10-CM | POA: Diagnosis present

## 2016-05-21 DIAGNOSIS — I130 Hypertensive heart and chronic kidney disease with heart failure and stage 1 through stage 4 chronic kidney disease, or unspecified chronic kidney disease: Secondary | ICD-10-CM | POA: Diagnosis present

## 2016-05-21 DIAGNOSIS — N183 Chronic kidney disease, stage 3 unspecified: Secondary | ICD-10-CM | POA: Diagnosis present

## 2016-05-21 DIAGNOSIS — I5042 Chronic combined systolic (congestive) and diastolic (congestive) heart failure: Secondary | ICD-10-CM

## 2016-05-21 DIAGNOSIS — J9801 Acute bronchospasm: Secondary | ICD-10-CM | POA: Diagnosis present

## 2016-05-21 LAB — I-STAT ARTERIAL BLOOD GAS, ED
Acid-base deficit: 1 mmol/L (ref 0.0–2.0)
Bicarbonate: 24.1 mmol/L (ref 20.0–28.0)
O2 SAT: 100 %
PCO2 ART: 38.6 mmHg (ref 32.0–48.0)
PH ART: 7.401 (ref 7.350–7.450)
PO2 ART: 241 mmHg — AB (ref 83.0–108.0)
Patient temperature: 97.8
TCO2: 25 mmol/L (ref 0–100)

## 2016-05-21 LAB — CBC WITH DIFFERENTIAL/PLATELET
Basophils Absolute: 0 10*3/uL (ref 0.0–0.1)
Basophils Relative: 0 %
EOS PCT: 0 %
Eosinophils Absolute: 0 10*3/uL (ref 0.0–0.7)
HEMATOCRIT: 27.4 % — AB (ref 36.0–46.0)
Hemoglobin: 9.3 g/dL — ABNORMAL LOW (ref 12.0–15.0)
LYMPHS ABS: 0.4 10*3/uL — AB (ref 0.7–4.0)
LYMPHS PCT: 6 %
MCH: 29.6 pg (ref 26.0–34.0)
MCHC: 33.9 g/dL (ref 30.0–36.0)
MCV: 87.3 fL (ref 78.0–100.0)
MONO ABS: 0.5 10*3/uL (ref 0.1–1.0)
Monocytes Relative: 8 %
Neutro Abs: 5.6 10*3/uL (ref 1.7–7.7)
Neutrophils Relative %: 86 %
PLATELETS: 199 10*3/uL (ref 150–400)
RBC: 3.14 MIL/uL — ABNORMAL LOW (ref 3.87–5.11)
RDW: 14.9 % (ref 11.5–15.5)
WBC: 6.5 10*3/uL (ref 4.0–10.5)

## 2016-05-21 LAB — I-STAT TROPONIN, ED: Troponin i, poc: 0 ng/mL (ref 0.00–0.08)

## 2016-05-21 LAB — I-STAT CHEM 8, ED
BUN: 19 mg/dL (ref 6–20)
CHLORIDE: 85 mmol/L — AB (ref 101–111)
Calcium, Ion: 1.11 mmol/L — ABNORMAL LOW (ref 1.15–1.40)
Creatinine, Ser: 1 mg/dL (ref 0.44–1.00)
Glucose, Bld: 123 mg/dL — ABNORMAL HIGH (ref 65–99)
HEMATOCRIT: 31 % — AB (ref 36.0–46.0)
HEMOGLOBIN: 10.5 g/dL — AB (ref 12.0–15.0)
POTASSIUM: 5.2 mmol/L — AB (ref 3.5–5.1)
SODIUM: 118 mmol/L — AB (ref 135–145)
TCO2: 23 mmol/L (ref 0–100)

## 2016-05-21 LAB — TROPONIN I: Troponin I: <0.03 ng/mL (ref <0.03)

## 2016-05-21 LAB — BASIC METABOLIC PANEL
ANION GAP: 11 (ref 5–15)
BUN: 17 mg/dL (ref 6–20)
CO2: 23 mmol/L (ref 22–32)
Calcium: 9 mg/dL (ref 8.9–10.3)
Chloride: 85 mmol/L — ABNORMAL LOW (ref 101–111)
Creatinine, Ser: 1.15 mg/dL — ABNORMAL HIGH (ref 0.44–1.00)
GFR calc Af Amer: 51 mL/min — ABNORMAL LOW (ref >60)
GFR, EST NON AFRICAN AMERICAN: 44 mL/min — AB (ref >60)
GLUCOSE: 150 mg/dL — AB (ref 65–99)
POTASSIUM: 5.6 mmol/L — AB (ref 3.5–5.1)
Sodium: 119 mmol/L — CL (ref 135–145)

## 2016-05-21 LAB — BRAIN NATRIURETIC PEPTIDE: B Natriuretic Peptide: 557.3 pg/mL — ABNORMAL HIGH (ref 0.0–100.0)

## 2016-05-21 LAB — SODIUM, URINE, RANDOM: Sodium, Ur: 11 mmol/L

## 2016-05-21 LAB — OSMOLALITY: Osmolality: 254 mOsm/kg — ABNORMAL LOW (ref 275–295)

## 2016-05-21 LAB — OSMOLALITY, URINE: Osmolality, Ur: 587 mOsm/kg (ref 300–900)

## 2016-05-21 MED ORDER — ZOLPIDEM TARTRATE 5 MG PO TABS
5.0000 mg | ORAL_TABLET | Freq: Every evening | ORAL | Status: DC | PRN
  Administered 2016-05-21: 5 mg via ORAL
  Filled 2016-05-21: qty 1

## 2016-05-21 MED ORDER — LEVOTHYROXINE SODIUM 75 MCG PO TABS
75.0000 ug | ORAL_TABLET | Freq: Every day | ORAL | Status: DC
  Administered 2016-05-21: 75 ug via ORAL
  Filled 2016-05-21 (×2): qty 1

## 2016-05-21 MED ORDER — ADULT MULTIVITAMIN W/MINERALS CH
1.0000 | ORAL_TABLET | Freq: Every day | ORAL | Status: DC
  Administered 2016-05-21 – 2016-05-22 (×2): 1 via ORAL
  Filled 2016-05-21 (×2): qty 1

## 2016-05-21 MED ORDER — ALPRAZOLAM 0.25 MG PO TABS
0.2500 mg | ORAL_TABLET | Freq: Two times a day (BID) | ORAL | Status: DC | PRN
  Administered 2016-05-21 (×2): 0.25 mg via ORAL
  Filled 2016-05-21 (×2): qty 1

## 2016-05-21 MED ORDER — ONDANSETRON HCL 4 MG/2ML IJ SOLN: 4.0000 mg | Freq: Four times a day (QID) | INTRAMUSCULAR | Status: DC | PRN

## 2016-05-21 MED ORDER — ALBUTEROL SULFATE (2.5 MG/3ML) 0.083% IN NEBU
2.5000 mg | INHALATION_SOLUTION | RESPIRATORY_TRACT | Status: DC | PRN
  Administered 2016-05-21 – 2016-05-22 (×2): 2.5 mg via RESPIRATORY_TRACT
  Filled 2016-05-21 (×2): qty 3

## 2016-05-21 MED ORDER — LORATADINE 10 MG PO TABS
10.0000 mg | ORAL_TABLET | Freq: Every day | ORAL | Status: DC
  Administered 2016-05-21: 10 mg via ORAL
  Filled 2016-05-21 (×2): qty 1

## 2016-05-21 MED ORDER — SODIUM CHLORIDE 0.9 % IV SOLN: 250.0000 mL | INTRAVENOUS | Status: DC | PRN

## 2016-05-21 MED ORDER — IPRATROPIUM BROMIDE 0.02 % IN SOLN
0.5000 mg | RESPIRATORY_TRACT | Status: DC
  Administered 2016-05-21 (×2): 0.5 mg via RESPIRATORY_TRACT
  Filled 2016-05-21: qty 2.5

## 2016-05-21 MED ORDER — ACETAMINOPHEN 500 MG PO TABS: 500.0000 mg | ORAL_TABLET | Freq: Four times a day (QID) | ORAL | Status: DC | PRN

## 2016-05-21 MED ORDER — IPRATROPIUM-ALBUTEROL 0.5-2.5 (3) MG/3ML IN SOLN
3.0000 mL | Freq: Three times a day (TID) | RESPIRATORY_TRACT | Status: DC
  Administered 2016-05-22: 3 mL via RESPIRATORY_TRACT
  Filled 2016-05-21: qty 3

## 2016-05-21 MED ORDER — HYDRALAZINE HCL 20 MG/ML IJ SOLN
10.0000 mg | INTRAMUSCULAR | Status: DC | PRN
  Administered 2016-05-21 – 2016-05-23 (×2): 10 mg via INTRAVENOUS
  Filled 2016-05-21 (×2): qty 1

## 2016-05-21 MED ORDER — ENOXAPARIN SODIUM 40 MG/0.4ML ~~LOC~~ SOLN
40.0000 mg | Freq: Every day | SUBCUTANEOUS | Status: DC
  Administered 2016-05-21: 40 mg via SUBCUTANEOUS
  Filled 2016-05-21 (×3): qty 0.4

## 2016-05-21 MED ORDER — DOXYCYCLINE HYCLATE 100 MG PO TABS
100.0000 mg | ORAL_TABLET | Freq: Two times a day (BID) | ORAL | Status: DC
  Administered 2016-05-21 – 2016-05-22 (×4): 100 mg via ORAL
  Filled 2016-05-21 (×5): qty 1

## 2016-05-21 MED ORDER — PANTOPRAZOLE SODIUM 40 MG PO TBEC
40.0000 mg | DELAYED_RELEASE_TABLET | Freq: Every day | ORAL | Status: DC
  Administered 2016-05-21 – 2016-05-22 (×2): 40 mg via ORAL
  Filled 2016-05-21 (×2): qty 1

## 2016-05-21 MED ORDER — IPRATROPIUM-ALBUTEROL 0.5-2.5 (3) MG/3ML IN SOLN
3.0000 mL | Freq: Three times a day (TID) | RESPIRATORY_TRACT | Status: DC
Start: 2016-05-21 — End: 2016-05-21
  Filled 2016-05-21: qty 3

## 2016-05-21 MED ORDER — IPRATROPIUM-ALBUTEROL 0.5-2.5 (3) MG/3ML IN SOLN
3.0000 mL | RESPIRATORY_TRACT | Status: DC
  Administered 2016-05-21 (×2): 3 mL via RESPIRATORY_TRACT
  Filled 2016-05-21 (×2): qty 3

## 2016-05-21 MED ORDER — SODIUM CHLORIDE 0.9% FLUSH
3.0000 mL | INTRAVENOUS | Status: DC | PRN
  Administered 2016-05-21: 3 mL via INTRAVENOUS

## 2016-05-21 MED ORDER — IPRATROPIUM-ALBUTEROL 0.5-2.5 (3) MG/3ML IN SOLN
3.0000 mL | Freq: Once | RESPIRATORY_TRACT | Status: DC
  Filled 2016-05-21: qty 3

## 2016-05-21 MED ORDER — SODIUM CHLORIDE 0.9% FLUSH
3.0000 mL | Freq: Two times a day (BID) | INTRAVENOUS | Status: DC
  Administered 2016-05-21 – 2016-05-22 (×2): 3 mL via INTRAVENOUS

## 2016-05-21 MED ORDER — MAGNESIUM HYDROXIDE 400 MG/5ML PO SUSP: 30.0000 mL | Freq: Every day | ORAL | Status: DC | PRN

## 2016-05-21 MED ORDER — IPRATROPIUM BROMIDE 0.02 % IN SOLN
1.0000 mg | Freq: Once | RESPIRATORY_TRACT | Status: AC
  Administered 2016-05-21: 1 mg via RESPIRATORY_TRACT
  Filled 2016-05-21: qty 5

## 2016-05-21 MED ORDER — BISACODYL 10 MG RE SUPP: 10.0000 mg | RECTAL | Status: DC | PRN

## 2016-05-21 MED ORDER — METHYLPREDNISOLONE SODIUM SUCC 125 MG IJ SOLR
60.0000 mg | Freq: Two times a day (BID) | INTRAMUSCULAR | Status: DC
  Administered 2016-05-22 – 2016-05-23 (×3): 60 mg via INTRAVENOUS
  Filled 2016-05-21 (×3): qty 2

## 2016-05-21 MED ORDER — LABETALOL HCL 100 MG PO TABS
150.0000 mg | ORAL_TABLET | Freq: Two times a day (BID) | ORAL | Status: DC
  Administered 2016-05-21 – 2016-05-22 (×3): 150 mg via ORAL
  Filled 2016-05-21: qty 2
  Filled 2016-05-21 (×3): qty 0.5

## 2016-05-21 MED ORDER — SODIUM POLYSTYRENE SULFONATE 15 GM/60ML PO SUSP
30.0000 g | Freq: Once | ORAL | Status: AC
Start: 2016-05-21 — End: 2016-05-21
  Administered 2016-05-21: 30 g via ORAL
  Filled 2016-05-21: qty 120

## 2016-05-21 MED ORDER — ONDANSETRON HCL 4 MG/2ML IJ SOLN
4.0000 mg | Freq: Once | INTRAMUSCULAR | Status: AC
  Administered 2016-05-21: 4 mg via INTRAVENOUS
  Filled 2016-05-21: qty 2

## 2016-05-21 MED ORDER — NITROGLYCERIN IN D5W 200-5 MCG/ML-% IV SOLN
0.0000 ug/min | INTRAVENOUS | Status: DC
  Administered 2016-05-21: 25 ug/min via INTRAVENOUS
  Administered 2016-05-21: 20 ug/min via INTRAVENOUS
  Administered 2016-05-21: 15 ug/min via INTRAVENOUS
  Administered 2016-05-22: 10 ug/min via INTRAVENOUS
  Administered 2016-05-23: 15 ug/min via INTRAVENOUS
  Filled 2016-05-21 (×2): qty 250

## 2016-05-21 MED ORDER — ALBUTEROL SULFATE (2.5 MG/3ML) 0.083% IN NEBU
5.0000 mg | INHALATION_SOLUTION | Freq: Once | RESPIRATORY_TRACT | Status: AC
  Administered 2016-05-21: 5 mg via RESPIRATORY_TRACT

## 2016-05-21 MED ORDER — OXYCODONE HCL 5 MG PO TABS: 5.0000 mg | ORAL_TABLET | Freq: Four times a day (QID) | ORAL | Status: DC | PRN

## 2016-05-21 MED ORDER — ASPIRIN EC 81 MG PO TBEC
81.0000 mg | DELAYED_RELEASE_TABLET | ORAL | Status: DC
  Administered 2016-05-21: 81 mg via ORAL
  Filled 2016-05-21: qty 1

## 2016-05-21 MED ORDER — METHYLPREDNISOLONE SODIUM SUCC 125 MG IJ SOLR
125.0000 mg | Freq: Once | INTRAMUSCULAR | Status: AC
  Administered 2016-05-21: 125 mg via INTRAVENOUS

## 2016-05-21 MED ORDER — FLUTICASONE PROPIONATE 50 MCG/ACT NA SUSP
1.0000 | NASAL | Status: DC | PRN
  Filled 2016-05-21: qty 16

## 2016-05-21 MED ORDER — ONDANSETRON HCL 4 MG/2ML IJ SOLN: 4.0000 mg | Freq: Three times a day (TID) | INTRAMUSCULAR | Status: DC | PRN

## 2016-05-21 MED ORDER — BUDESONIDE 0.5 MG/2ML IN SUSP
0.2500 mg | Freq: Two times a day (BID) | RESPIRATORY_TRACT | Status: DC
  Administered 2016-05-21: 0.25 mg via RESPIRATORY_TRACT
  Administered 2016-05-22: 0.5 mg via RESPIRATORY_TRACT
  Administered 2016-05-22 – 2016-05-23 (×3): 0.25 mg via RESPIRATORY_TRACT
  Filled 2016-05-21 (×7): qty 2

## 2016-05-21 MED ORDER — METHYLPREDNISOLONE SODIUM SUCC 125 MG IJ SOLR
60.0000 mg | Freq: Two times a day (BID) | INTRAMUSCULAR | Status: DC
  Filled 2016-05-21: qty 2

## 2016-05-21 MED ORDER — SENNOSIDES-DOCUSATE SODIUM 8.6-50 MG PO TABS
2.0000 | ORAL_TABLET | Freq: Every day | ORAL | Status: DC | PRN
  Filled 2016-05-21: qty 2

## 2016-05-21 MED ORDER — FLEET ENEMA 7-19 GM/118ML RE ENEM
1.0000 | ENEMA | RECTAL | Status: DC | PRN
  Filled 2016-05-21: qty 1

## 2016-05-21 MED ORDER — METHYLPREDNISOLONE SODIUM SUCC 125 MG IJ SOLR: 125.0000 mg | Freq: Two times a day (BID) | INTRAMUSCULAR | Status: DC

## 2016-05-21 MED ORDER — NITROGLYCERIN IN D5W 200-5 MCG/ML-% IV SOLN
0.0000 ug/min | Freq: Once | INTRAVENOUS | Status: AC
  Administered 2016-05-21: 5 ug/min via INTRAVENOUS
  Filled 2016-05-21: qty 250

## 2016-05-21 MED ORDER — FLECAINIDE ACETATE 50 MG PO TABS
50.0000 mg | ORAL_TABLET | Freq: Two times a day (BID) | ORAL | Status: DC
  Filled 2016-05-21: qty 1

## 2016-05-21 NOTE — ED Notes (Signed)
Nurse drawing labs. 

## 2016-05-21 NOTE — ED Notes (Signed)
Admitting MD at the bedside.  

## 2016-05-21 NOTE — ED Triage Notes (Addendum)
[facility] reports pt coming from home after reporting difficulting breathing. EMS reports audible Rhonchi with short incomplete sentence. 0.4 Nitro, alubterol, on CPAP with 10 of PEEP. EMS reports Pt denies being ill. EMS reports pt in A-fib with a history of same. 20g IV in right hand, EMS reports left hand restriction. EMS reports no excess swelling. BP-161/117, P-70, R-22, 95% on CPAP.    Pt reports SOB that woke her up. Pt denies any recent coughs, fevers, or illnesses. Pt reports needed to be on BIPAP or a machine. Pt denies any swelling.

## 2016-05-21 NOTE — Progress Notes (Signed)
RT placed patient on BIPAP per MD verbal order. Patient placed on 10/5 and 50%. Patient tolerating well at this time.

## 2016-05-21 NOTE — ED Notes (Signed)
Pt reporting she feels like she cant breathe. RT at the bedside. Going to place patient on the BiPap

## 2016-05-21 NOTE — ED Notes (Addendum)
Pt states she was awakened by trouble breathing and immediately called 9-1-1. Pt denies any recent coughs, fevers, or illnesses. Pt reports needed to be on BIPAP or a machine. Pt denies any swelling.

## 2016-05-21 NOTE — Care Management Note (Signed)
Case Management Note  Patient Details  Name: Beth [name] MRN: [id number] Date of Birth: [birth year]  Subjective/Objective:                  80 y.o. female, with Hx of A-fib, CHF, HTN, brought in by ambulance, who presents to the Emergency Department complaining of gradually worsening shortness of breath that started this evening. From home alone.  Action/Plan: Follow for disposition needs.   Expected Discharge Date:  05/25/16               Expected Discharge Plan:  [place]  In-House Referral:  NA  Discharge planning Services  CM Consult   Status of Service:  In process, will continue to follow      Fuller Mandril, RN 05/14/2016, 11:56 AM

## 2016-05-21 NOTE — ED Notes (Signed)
RT at the bedside to assess patient

## 2016-05-21 NOTE — ED Notes (Signed)
Ordered patient a hospital bed

## 2016-05-21 NOTE — ED Notes (Signed)
Ordered Patient's HH tray

## 2016-05-21 NOTE — ED Notes (Signed)
Renal tray REordered

## 2016-05-21 NOTE — Progress Notes (Signed)
RT transported patient from ed to 3W without any complications.

## 2016-05-21 NOTE — H&P (Signed)
History and Physical    Beth [name] [id number] DOB: March 19, 1936 DOA: 05/06/2016  Referring MD/NP/PA:   PCP: Imagene Riches, MD   Patient coming from:  The patient is coming from home.  At baseline, pt is independent for most of ADL.       Chief Complaint: SOB  HPI: Beth [name] is a 80 y.o. female with medical history significant of dCHF, mitral regurgitation, ascending and transverse thoracic aortic aneurysm (s/p of endograft repair in February 2006 at [facility]), descending thoracic aortic aneurysm (s/p of percutaneous repair/endograft 2010 in [facility]),  HTN, GERD, hypothyroidism, breast cancer, dementia, atrial fibrillation not on anticoagulants, CKD-III, former smoker (quit 40 years ago), who presents with SOB.  Patient states that she started having shortness of breath yesterday morning, which has been progressively getting worse. She has mild dry cough. She does not have chest pain, fever or chills. She could not speak in full sentences on arrival to ED. No fever or chills. No runny nose or sore throat. Patient denies nausea, vomiting, diarrhea, abdominal pain, symptoms of UTI or unilateral weakness.   ED Course: pt was found to have BNP 557, WBC 6.5, potassium 5.6 without T wave peaking on EKG, stable renal function, sodium 119, temperature normal, bradycardia, O2 sat 99% on BiPAP. CXR showed decreased aeration of both lungs-->possible atelectasis. Pt was started with BiPAP and nitro drip with some improvement of shortness of breath. Patient is admitted to stepdown bed as inpatient.  Review of Systems:   General: no fevers, chills, has fatigue HEENT: no blurry vision, hearing changes or sore throat Respiratory: has dyspnea, wheezing, coughing CV: no chest pain, no palpitations GI: no nausea, vomiting, abdominal pain, diarrhea, constipation GU: no dysuria, burning on urination, increased urinary frequency, hematuria  Ext: has leg edema Neuro: no unilateral  weakness, numbness, or tingling, no vision change or hearing loss Skin: no rash, no skin tear. MSK: No muscle spasm, no deformity, no limitation of range of movement in spin Heme: No easy bruising.  Travel history: No recent long distant travel.  Allergy: No Known Allergies  Past Medical History:  Diagnosis Date  . Acute diastolic heart failure, in combination with MR and atrial fib 12/11/2013  . Acute renal failure (ARF) ([place])   . Aortic aneurysm with dissection ([place])   . Aortic aneurysm, descending ([place])   . Atrial fibrillation ([place])   . Breast CA ([place])   . CHF (congestive heart failure) ([place])   . Hypertension   . Hypomagnesemia, replaced 12/11/2013  . Lymphedema left arm  . Thyroid disease   . UTI (urinary tract infection) 12/11/2013    Past Surgical History:  Procedure Laterality Date  . ABDOMINAL HYSTERECTOMY    . ASCENDING AORTIC ANEURYSM REPAIR    . BREAST LUMPECTOMY    . CARDIOVERSION N/A 12/10/2013   Procedure: CARDIOVERSION   (bedside);  Surgeon: Peter M Martinique, MD;  Location: [place];  Service: Cardiovascular;  Laterality: N/A;  . DESCENDING AORTIC ANEURYSM REPAIR      Social History:  reports that she has quit smoking. She has never used smokeless tobacco. She reports that she does not drink alcohol or use drugs.  Family History:  Family History  Problem Relation Age of Onset  . Heart disease Mother   . Heart disease Father   . Heart attack Sister   . Heart attack Brother      Prior to Admission medications   Medication Sig Start Date End Date Taking? Authorizing Provider  acetaminophen (TYLENOL) 500 MG tablet Take 500 mg by mouth every 6 (six) hours as needed for mild pain.    Yes Historical Provider, MD  aspirin EC 81 MG tablet Take 81 mg by mouth every other day.    Yes Historical Provider, MD  bisacodyl (DULCOLAX) 10 MG suppository Place 10 mg rectally as needed for moderate constipation.   Yes Historical Provider, MD  cetirizine (ZYRTEC) 10 MG tablet Take  10 mg by mouth as needed for allergies.    Yes Historical Provider, MD  flecainide (TAMBOCOR) 100 MG tablet Take 50 mg by mouth 2 (two) times daily.    Yes Historical Provider, MD  fluticasone (FLONASE) 50 MCG/ACT nasal spray Place 1 spray into both nostrils as needed for allergies.    Yes Historical Provider, MD  furosemide (LASIX) 20 MG tablet Take 1 tablet (20 mg total) by mouth daily. 01/14/14  Yes Darlin Coco, MD  labetalol (NORMODYNE) 300 MG tablet Take 150 mg by mouth every 12 (twelve) hours.   Yes Historical Provider, MD  levothyroxine (SYNTHROID, LEVOTHROID) 75 MCG tablet Take 75 mcg by mouth daily before breakfast.   Yes Historical Provider, MD  losartan (COZAAR) 50 MG tablet Take 50 mg by mouth daily.   Yes Historical Provider, MD  magnesium hydroxide (MILK OF MAGNESIA) 400 MG/5ML suspension Take 30 mLs by mouth daily as needed for mild constipation.   Yes Historical Provider, MD  Multiple Vitamin (MULTIVITAMIN WITH MINERALS) TABS tablet Take 1 tablet by mouth daily.   Yes Historical Provider, MD  oxyCODONE (OXY IR/ROXICODONE) 5 MG immediate release tablet Take 5 mg by mouth every 6 (six) hours as needed for severe pain.   Yes Historical Provider, MD  pantoprazole (PROTONIX) 40 MG tablet Take 40 mg by mouth daily.   Yes Historical Provider, MD  senna-docusate (SENOKOT-S) 8.6-50 MG tablet Take 2 tablets by mouth daily as needed for mild constipation.    Yes Historical Provider, MD  Sodium Phosphates (RA SALINE ENEMA) 19-7 GM/118ML ENEM Place 1 each rectally as needed (for constipation).   Yes Historical Provider, MD    Physical Exam: Vitals:   05/20/2016 0315 05/03/2016 0345 04/27/2016 0400 05/04/2016 0415  BP: (!) 177/104 170/94 (!) 160/136 (!) 166/120  Pulse: 68 98 (!) 44 64  Resp: 18 20 21 17   Temp:  97.8 F (36.6 C)    SpO2: 100% 99% 100% 100%  Weight:      Height:       General: Not in acute distress HEENT:       Eyes: PERRL, EOMI, no scleral icterus.       ENT: No discharge  from the ears and nose, no pharynx injection, no tonsillar enlargement.        Neck: positive JVD, no bruit, no mass felt. Heme: No neck lymph node enlargement. Cardiac: S1/S2, RRR, No murmurs, No gallops or rubs. Respiratory: Decreased air movement bilaterally. Has wheezing bilaterally. No rales or rubs. GI: Soft, nondistended, nontender, no rebound pain, no organomegaly, BS present. GU: No hematuria Ext: 1+ pitting leg edema bilaterally. 2+DP/PT pulse bilaterally. Musculoskeletal: No joint deformities, No joint redness or warmth, no limitation of ROM in spin. Skin: No rashes.  Neuro: Alert, oriented X3, cranial nerves II-XII grossly intact, moves all extremities normally. Psych: Patient is not psychotic, no suicidal or hemocidal ideation.  Labs on Admission: I have personally reviewed following labs and imaging studies  CBC:  Recent Labs Lab 05/20/2016 0210 05/22/2016 0425  WBC 6.5  --  NEUTROABS 5.6  --   HGB 9.3* 10.5*  HCT 27.4* 31.0*  MCV 87.3  --   PLT 199  --    Basic Metabolic Panel:  Recent Labs Lab 05/14/2016 0210 05/02/2016 0425  NA 119* 118*  K 5.6* 5.2*  CL 85* 85*  CO2 23  --   GLUCOSE 150* 123*  BUN 17 19  CREATININE 1.15* 1.00  CALCIUM 9.0  --    GFR: Estimated Creatinine Clearance: 40 mL/min (by C-G formula based on SCr of 1 mg/dL). Liver Function Tests: No results for input(s): AST, ALT, ALKPHOS, BILITOT, PROT, ALBUMIN in the last 168 hours. No results for input(s): LIPASE, AMYLASE in the last 168 hours. No results for input(s): AMMONIA in the last 168 hours. Coagulation Profile: No results for input(s): INR, PROTIME in the last 168 hours. Cardiac Enzymes: No results for input(s): CKTOTAL, CKMB, CKMBINDEX, TROPONINI in the last 168 hours. BNP (last 3 results) No results for input(s): PROBNP in the last 8760 hours. HbA1C: No results for input(s): HGBA1C in the last 72 hours. CBG: No results for input(s): GLUCAP in the last 168 hours. Lipid  Profile: No results for input(s): CHOL, HDL, LDLCALC, TRIG, CHOLHDL, LDLDIRECT in the last 72 hours. Thyroid Function Tests: No results for input(s): TSH, T4TOTAL, FREET4, T3FREE, THYROIDAB in the last 72 hours. Anemia Panel: No results for input(s): VITAMINB12, FOLATE, FERRITIN, TIBC, IRON, RETICCTPCT in the last 72 hours. Urine analysis:    Component Value Date/Time   COLORURINE YELLOW 12/09/2013 0802   APPEARANCEUR CLEAR 12/09/2013 0802   LABSPEC 1.008 12/09/2013 0802   PHURINE 6.0 12/09/2013 0802   GLUCOSEU NEGATIVE 12/09/2013 0802   HGBUR MODERATE (A) 12/09/2013 0802   BILIRUBINUR NEGATIVE 12/09/2013 0802   KETONESUR NEGATIVE 12/09/2013 0802   PROTEINUR NEGATIVE 12/09/2013 0802   UROBILINOGEN 0.2 12/09/2013 0802   NITRITE POSITIVE (A) 12/09/2013 0802   LEUKOCYTESUR SMALL (A) 12/09/2013 0802   Sepsis Labs: @LABRCNTIP (procalcitonin:4,lacticidven:4) )No results found for this or any previous visit (from the past 240 hour(s)).   Radiological Exams on Admission: Dg Chest Portable 1 View  Result Date: 05/10/2016 CLINICAL DATA:  Difficulty breathing. EXAM: PORTABLE CHEST 1 VIEW COMPARISON:  Chest radiograph 04/16/2016 FINDINGS: Median sternotomy wires and thoracic aortic endograft stent are unchanged. Cardiac silhouette with upper mediastinal widening is also unchanged. Lung aeration is decreased compared to the prior study, particularly at the left lung base. No pneumothorax or pleural effusion. IMPRESSION: Decreased aeration of both lungs, most notably at the lung bases, likely atelectasis. Electronically Signed   By: Ulyses Jarred M.D.   On: 05/16/2016 02:32     EKG: Independently reviewed.  Atrial fibrillation, QTC 519, RAD, anteroseptal infarction pattern.   Assessment/Plan Principal Problem:   Acute respiratory failure with hypoxia (HCC) Active Problems:   Hypothyroid   Acute on chronic diastolic CHF (congestive heart failure) (HCC)   Hypomagnesemia, replaced   Chronic  atrial fibrillation (HCC)   GERD (gastroesophageal reflux disease)   HTN (hypertension)   Hyponatremia   Hyperkalemia   CKD (chronic kidney disease), stage III   Acute on chronic respiratory failure with hypoxia (HCC)   Acute respiratory failure with hypoxia: Likely due to acute on chronic diastolic CHF given elevated BNP 557, positive JVD and leg edema . Pt also has wheezing on auscultation, indicating bronchospasm component. No infiltration on chest x-ray. No chest pain or signs of DVT, less likely to have PE. Pt has severe hyponatremia with sodium 119, which limits the use of diuretics.  -  will admit to SDU as inpt -hold off Lasix -Continue nitro drip -Scheduled Atrovent nebulizer and albuterol nebulizer when necessary -trop x 3 -2d echo -will continue home labetalol and ASA -Daily weights -strict I/O's -hold Cozarr due to severe hyponatremia -please consult to card in AM  Atrial Fibrillation: CHA2DS2-VASc Score is 6, needs oral anticoagulation, but patient is on [facility], with unclear reason, but possibly due to high risk of fall secondary to dementia. Heart rate is well controlled. -continue flecainide and labetalol -Continue aspirin  Hypothyroidism: Last TSH was 1.15 on 12/09/13 -Continue home Synthroid -Check TSH  GERD: -Protonix  HTN: -on nitro drip -Hold Cozaar and lasix due to severe hyponatremia  Hyperkalemia: K=5.6 without T wave peaking on EKG -Kayexalate 30 g 1  CKD (chronic kidney disease), stage III: Stable. Baseline creatinine by report 1-1.3. Her creatinine is 1.15 on admission -Follow-up renal function by BMP  Hyponatremia: Na 119. Likely due to decreased oral intake and lasix use. Mental status normal. Very difficult situation. Cannot give IV normal saline due to CHF exacerbation. - hold Cozarr - check urine sodium, urine osmolality, serum osmolality. - check TSH   Normocytic anemia: Hemoglobin 9.3 which was 11.0 on 10/16/15. No rectal bleeding. -Anemia  panel   DVT ppx: SQ Lovenox Code Status: Full code Family Communication: None at bed side.  Disposition Plan:  Anticipate discharge back to previous home environment Consults called:  none Admission status:  SDU/inpation       Date of Service 05/05/2016    Ivor Costa [facility] Pager [phone]  If 7PM-7AM, please contact night-coverage [email or web address] Password [facility] 05/16/2016, 5:47 AM

## 2016-05-21 NOTE — Progress Notes (Signed)
05/16/2016  1:53 AM  05/15/2016 12:51 PM  Beth [name] was seen and examined.  The H&P by the admitting provider , orders, imaging was reviewed.  Pt seems to be having more COPD exacerbation to me.  Ordered scheduled nebs, solumedrol IV and doxycycline.  Please see orders.  Will continue to follow.  Updated son at bedside.    Murvin Natal, MD [facility]

## 2016-05-21 NOTE — ED Notes (Signed)
Pt's caregiver at the bedside

## 2016-05-21 NOTE — ED Notes (Signed)
Dr. Dina Rich notified of critical sodium of 119.

## 2016-05-21 NOTE — Consult Note (Signed)
CARDIOLOGY CONSULT NOTE   Patient ID: JORDAIN ABELSON MRN: [id number] DOB/AGE: 1935/09/12 80 y.o.  Admit date: 05/07/2016  Requesting Physician: Dr. Erlinda Hong  Primary Physician:   Imagene Riches, MD Primary Cardiologist: Dr. Mare Ferrari (seen more recently by Dr. Johnsie Cancel) Reason for Consultation:  SOB  HPI: AGELIKI WESTLAKE is a 80 y.o. female with a history of ascending, transverse and descending thoracic aortic aneurysms s/p multiple surgeries (see below), chronic mixed S/D CHF, mod MR, chroinc afib (Xarelto d/c'd in 09/2015 due to endoleak), CKD, former smoker and HTN who presented to [facility] today with complaints of SOB x 5 days.    She has a past history of having had surgery for an ascending and transverse thoracic aortic aneurysm treated with endograft in 07/2004 at [facility]. She had coronary angiography in 2006 which showed smooth and normal coronary arteries. In 04/2008 she had surgery on a descending thoracic aortic aneurysm also at [facility]. In 2010 she underwent percutaneous repair of her endograft.   She was admitted to [facility] in 09/2015 for vague chest pain. 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 Her Xarelto was discontinued during this admission given her type 1 endoleak of her distal thoracic endograft. She is followed by Dr. Sammuel Hines who has offered different options on medical vs invasive treatment.   Most recent ECHO at [facility] 12/2015 showed EF 45-50%, restrictive LV filling pattern, mild LAE/RAE, mild AR, mild-mod MR.   She has chronic afib s/p multiple failed cardioversion, last in 2015. However, she has been continued on Flecainide?   She was in her usual state of health until last Thursday on Thanksgiving when she started feeling poorly. She then started noticing increased SOB. No Chest pain. She does have orthopnea, PND and left hand  edema. She also has noticed a dry cough. Not productive. No fevers or chills. No increased salt intake.    ED Course: patient could not speak in full sentences and was stared on BIPAP. BNP 557, WBC 6.5, potassium 5.6, creat 1.15, GFR 44, sodium 119, temperature normal, troponin <0.03, H/H 9.08/2732, O2 sat 99% on BiPAP. CXR showed decreased aeration of both lungs-->possible atelectasis. BiPAP and nitro drip with some improvement of shortness of breath.    Past Medical History:  Diagnosis Date  . Acute diastolic heart failure, in combination with MR and atrial fib 12/11/2013  . Acute renal failure (ARF) ([place])   . Aortic aneurysm with dissection ([place])   . Aortic aneurysm, descending ([place])   . Atrial fibrillation ([place])   . Breast CA ([place])   . CHF (congestive heart failure) ([place])   . Hypertension   . Hypomagnesemia, replaced 12/11/2013  . Lymphedema left arm  . Thyroid disease   . UTI (urinary tract infection) 12/11/2013     Past Surgical History:  Procedure Laterality Date  . ABDOMINAL HYSTERECTOMY    . ASCENDING AORTIC ANEURYSM REPAIR    . BREAST LUMPECTOMY    . CARDIOVERSION N/A 12/10/2013   Procedure: CARDIOVERSION   (bedside);  Surgeon: Peter M Martinique, MD;  Location: [facility] OR;  Service: Cardiovascular;  Laterality: N/A;  . DESCENDING AORTIC ANEURYSM REPAIR      No Known Allergies  I have reviewed the patient's current medications . aspirin EC  81 mg Oral QODAY  . enoxaparin (LOVENOX) injection  40 mg Subcutaneous  Daily  . flecainide  50 mg Oral BID  . ipratropium-albuterol  3 mL Nebulization TID  . labetalol  150 mg Oral Q12H  . levothyroxine  75 mcg Oral QAC breakfast  . loratadine  10 mg Oral Daily  . multivitamin with minerals  1 tablet Oral Daily  . pantoprazole  40 mg Oral Daily  . sodium chloride flush  3 mL Intravenous Q12H  . sodium polystyrene  30 g Oral Once   . sodium chloride    . nitroGLYCERIN 15 mcg/min (05/15/2016 0529)   sodium chloride, acetaminophen,  albuterol, ALPRAZolam, bisacodyl, fluticasone, magnesium hydroxide, oxyCODONE, senna-docusate, sodium chloride flush, sodium phosphate, zolpidem  Prior to Admission medications   Medication Sig Start Date End Date Taking? Authorizing Provider  acetaminophen (TYLENOL) 500 MG tablet Take 500 mg by mouth every 6 (six) hours as needed for mild pain.    Yes Historical Provider, MD  aspirin EC 81 MG tablet Take 81 mg by mouth every other day.    Yes Historical Provider, MD  bisacodyl (DULCOLAX) 10 MG suppository Place 10 mg rectally as needed for moderate constipation.   Yes Historical Provider, MD  cetirizine (ZYRTEC) 10 MG tablet Take 10 mg by mouth as needed for allergies.    Yes Historical Provider, MD  flecainide (TAMBOCOR) 100 MG tablet Take 50 mg by mouth 2 (two) times daily.    Yes Historical Provider, MD  fluticasone (FLONASE) 50 MCG/ACT nasal spray Place 1 spray into both nostrils as needed for allergies.    Yes Historical Provider, MD  furosemide (LASIX) 20 MG tablet Take 1 tablet (20 mg total) by mouth daily. 01/14/14  Yes Darlin Coco, MD  labetalol (NORMODYNE) 300 MG tablet Take 150 mg by mouth every 12 (twelve) hours.   Yes Historical Provider, MD  levothyroxine (SYNTHROID, LEVOTHROID) 75 MCG tablet Take 75 mcg by mouth daily before breakfast.   Yes Historical Provider, MD  losartan (COZAAR) 50 MG tablet Take 50 mg by mouth daily.   Yes Historical Provider, MD  magnesium hydroxide (MILK OF MAGNESIA) 400 MG/5ML suspension Take 30 mLs by mouth daily as needed for mild constipation.   Yes Historical Provider, MD  Multiple Vitamin (MULTIVITAMIN WITH MINERALS) TABS tablet Take 1 tablet by mouth daily.   Yes Historical Provider, MD  oxyCODONE (OXY IR/ROXICODONE) 5 MG immediate release tablet Take 5 mg by mouth every 6 (six) hours as needed for severe pain.   Yes Historical Provider, MD  pantoprazole (PROTONIX) 40 MG tablet Take 40 mg by mouth daily.   Yes Historical Provider, MD    senna-docusate (SENOKOT-S) 8.6-50 MG tablet Take 2 tablets by mouth daily as needed for mild constipation.    Yes Historical Provider, MD  Sodium Phosphates (RA SALINE ENEMA) 19-7 GM/118ML ENEM Place 1 each rectally as needed (for constipation).   Yes Historical Provider, MD     Social History   Social History  . Marital status: Widowed    Spouse name: N/A  . Number of children: N/A  . Years of education: N/A   Occupational History  . Not on file.   Social History Main Topics  . Smoking status: Former Research scientist (life sciences)  . Smokeless tobacco: Never Used  . Alcohol use No  . Drug use: No  . Sexual activity: Not on file   Other Topics Concern  . Not on file   Social History Narrative  . No narrative on file    Family Status  Relation Status  . Mother   .  Father   . Sister   . Brother    Family History  Problem Relation Age of Onset  . Heart disease Mother   . Heart disease Father   . Heart attack Sister   . Heart attack Brother     ROS:  Full 14 point review of systems complete and found to be negative unless listed above.  Physical Exam: Blood pressure (!) 163/102, pulse 69, temperature 97.8 F (36.6 C), resp. rate 14, height 5\' 4"  (1.626 m), weight 142 lb (64.4 kg), SpO2 100 %.  General: Well developed, well nourished, female in no acute distress, on BIPAP Head: Eyes PERRLA, No xanthomas.   Normocephalic and atraumatic, oropharynx without edema or exudate.   Lungs: diffuse wheezing Heart: HRRR S1 S2, no rub/gallop, Heart irregular rate and rhythm with S1, S2  murmur. pulses are 2+ extrem.   Neck: No carotid bruits. No lymphadenopathy.  No JVD. Abdomen: Bowel sounds present, abdomen soft and non-tender without masses or hernias noted. Msk:  No spine or cva tenderness. No weakness, no joint deformities or effusions. Extremities: No clubbing or cyanosis.  Trace bilateral LE edema.  Neuro: Alert and oriented X 3. No focal deficits noted. Psych:  Good affect, responds  appropriately Skin: No rashes or lesions noted.  Labs:   Lab Results  Component Value Date   WBC 6.5 05/14/2016   HGB 10.5 (L) 05/17/2016   HCT 31.0 (L) 05/15/2016   MCV 87.3 05/09/2016   PLT 199 05/15/2016   No results for input(s): INR in the last 72 hours.  Recent Labs Lab 05/15/2016 0210 05/14/2016 0425  NA 119* 118*  K 5.6* 5.2*  CL 85* 85*  CO2 23  --   BUN 17 19  CREATININE 1.15* 1.00  CALCIUM 9.0  --   GLUCOSE 150* 123*    Recent Labs  05/03/2016 0634  TROPONINI <0.03    Recent Labs  05/05/2016 0221  TROPIPOC 0.00   Pro B Natriuretic peptide (BNP)  Date/Time Value Ref Range Status  01/15/2014 10:55 AM 12,241.0 (H) 0 - 450 pg/mL Final  12/09/2013 06:55 AM 7,619.0 (H) 0 - 450 pg/mL Final   Lab Results  Component Value Date   CHOL 225 (H) 10/20/2013   HDL 117.00 10/20/2013   LDLCALC 97 10/20/2013   TRIG 57.0 10/20/2013   No results found for: DDIMER No results found for: LIPASE, AMYLASE TSH  Date/Time Value Ref Range Status  12/09/2013 02:38 PM 1.150 0.350 - 4.500 uIU/mL Final   No results found for: VITAMINB12, FOLATE, FERRITIN, TIBC, IRON, RETICCTPCT  Echo: 01/18/16 SUMMARY The left ventricular size is normal. There is normal left ventricular wall thickness. LV ejection fraction = 45-50%. Overall left ventricular function appears to be mildly reduced . Left ventricular filling pattern is restrictive. The right ventricle is normal in size and function. The left atrium is mildly dilated. The right atrium is mildly dilated. There is aortic valve sclerosis. Mild aortic regurgitation. There is mild to moderate mitral regurgitation. The mitral regurgitant jet is eccentrically directed. IVC size was mildly dilated. There is no pericardial effusion.  CTA chest/abdpelvis w/wo contrast: 09/26/15  1. Status post repair of a transverse and descending thoracic aortic aneurysm with presence of a type I B endoleak. Interval increase in size of the native  aneurysm sac, now measuring 6.1 cm in diameter (series 10, image 25) relative to 5.3 cm on the prior CT from 05/05/2014.  2. Interval increase in size of the abdominal aortic aneurysm, now  measuring 4.1 cm in diameter at the level of the celiac artery relative to 3.5 cm on the prior study.    ECG: afib HR 94  Radiology:  Dg Chest Portable 1 View  Result Date: 05/10/2016 CLINICAL DATA:  Difficulty breathing. EXAM: PORTABLE CHEST 1 VIEW COMPARISON:  Chest radiograph 04/16/2016 FINDINGS: Median sternotomy wires and thoracic aortic endograft stent are unchanged. Cardiac silhouette with upper mediastinal widening is also unchanged. Lung aeration is decreased compared to the prior study, particularly at the left lung base. No pneumothorax or pleural effusion. IMPRESSION: Decreased aeration of both lungs, most notably at the lung bases, likely atelectasis. Electronically Signed   By: Ulyses Jarred M.D.   On: 05/09/2016 02:32    ASSESSMENT AND PLAN:    Principal Problem:   Acute respiratory failure with hypoxia (HCC) Active Problems:   Hypothyroid   Acute on chronic diastolic CHF (congestive heart failure) (HCC)   Hypomagnesemia, replaced   Chronic atrial fibrillation (HCC)   GERD (gastroesophageal reflux disease)   HTN (hypertension)   Hyponatremia   Hyperkalemia   CKD (chronic kidney disease), stage III   Acute on chronic respiratory failure with hypoxia ([place])   RAELIE HIGLEY is a 80 y.o. female with a history of ascending, transverse and descending thoracic aortic aneurysms s/p multiple surgeries (see below), chronic mixed S/D CHF, mod MR, chroinc afib (Xarelto d/c'd in 09/2015 due to endoleak), CKD, former smoker and HTN who presented to [facility] today with complaints of SOB x 5 days.    Acute on chronic combined S/D CHF: BNP ~500. CXR with no overt pulmonary edema. Not significantly volume overloaded on exam. Weight is actually down from a recent office visit in October when she was 159 lbs (now  142).  -- Last EF 54-50% by 2D ECHO in 12/2015 at [facility]. Repeat 2D ECHO pending.  -- Currently on BIPAP and nitro gtt.  -- Lasix on hold due to hyponatremia. Sounds quite wheezy on exam. Would treat with nebs and possibly steroids. Will wait for 2D ECHO and follow along with you. -- If she does not improve and does require diuresis we may need to think about treating hyponatremia with Tolvaptan.   Chronic atrial fibrillation: she has been continued on Flecainide for unclear reasons. She has structural heart diease and has permanant atrial fibrillation. Will discontinue this now.  CHA2DS2-VASc Score is 6 but not a candidate for oral anticoagulation given her endoleak. Rate currently well controlled   Hyponatremia: continue fluid restriction and follow. Agree with holding lasix for now.   CKD stage III: Stable. Baseline creatinine by report 1-1.3. Her creatinine is 1.15 on admission  Hyperkalemia: K=5.6. ECG stable. Given Kayexalate 30 g 1  HTN: BP has been elevated. Continue nitro gtt.    Signed: Angelena Form, PA-C 05/20/2016 8:14 AM  Pager [phone]  Co-Sign MD Patient seen and examined and history reviewed. Agree with above findings and plan. 80 yo WF with complex history. History of aortic aneurysmal disease s/p multiple surgeries and endograft procedures at [facility]. Followed by Dr. Sammuel Hines with endoleak at the distal endograft border. She has permanent Afib with multiple failed cardioversion. Unclear why she is still on Flecainide. She presents with acute onset respiratory failure. She denies any edema, weight gain. Minimal cough. She does have a history of CHF in the past with EF 45%. When admitted with CHF in 2015 BNP levels in the 7-12K range. On exam she is sitting up on Bipap. No JVD.  Severe diffuse expiratory  wheezing. CV IRR without gallop or murmur. Abd soft- no ascites No edema.  Ecg shows afib with controlled rate. CXR shows no edema BNP 547  I am not impressed  that she has significant volume overload at this point. She has hyponatremia. Suspect she is euvolemic or intravascularly depleted. I would recommend holding diuretics at this point. Check urine sodium/chloride. Treat acute bronchospasm with steroids and nebulizers. Will check Echo. If diuresis is needed later would need to consider Tolvaptan. Continue Bipap. Need to stop Flecainide (no role in permanent Afib). Will follow.   Peter Martinique, [place] 04/25/2016 9:43 AM

## 2016-05-21 NOTE — ED Notes (Signed)
MD Jordan at the bedside.  

## 2016-05-21 NOTE — ED Notes (Signed)
RT at the bedside removing BiPap

## 2016-05-21 NOTE — Progress Notes (Signed)
05/22/2016 4:08 PM  Came to reassess patient and found her to be much more comfortable on bipap therapy.  I asked if she could try to tolerate bipap therapy a little longer as she seems to be doing much better on it.  Updated son and daughter at bedside.  Still awaiting a stepdown unit bed.  I am not comfortable with lower level of care because she is on nitro drip and still does need bipap therapy.    Murvin Natal, MD

## 2016-05-21 NOTE — ED Notes (Signed)
Meal Tray brought to patient. RT reports that patient requested to drink. They tried her off the BiPap. Shortly after drinking, pt requested to go back on biPap

## 2016-05-21 NOTE — ED Provider Notes (Signed)
[name] DEPT Provider Note   CSN: [id number] Arrival date & time: 05/22/2016  0153  By signing my name below, I, Beth [name], attest that this documentation has been prepared under the direction and in the presence of Ivor Costa, MD. Electronically signed, Beth [name], ED Scribe. 05/09/2016. 2:01 AM.   History   Chief Complaint Chief Complaint  Patient presents with  . Shortness of Breath    HPI HPI Comments: Beth [name] is a 80 y.o. female, with Hx of A-fib, CHF, HTN, brought in by ambulance, who presents to the Emergency Department complaining of gradually worsening shortness of breath that started this evening. Per EMS, pt was tachypneic with audible rhonchi heard on arrival and in route. Pt reports mild relief of shortness of breath with 5mg  albuterol given by EMS. Pt was also given 0.4 NTG and placed on CPAP. Vitals with CPAP as follows: 161/117, HR 70, R 22, O2 sat 95%. Pt admits to previous similar episodes where she needed BiPAP. She is a former smoker. No recent leg swelling. No chest pain.  Limited history secondary to acuity of condition. Level V caveat.  The history is provided by the patient and the EMS personnel. No language interpreter was used.    Past Medical History:  Diagnosis Date  . Acute diastolic heart failure, in combination with MR and atrial fib 12/11/2013  . Acute renal failure (ARF) ([place])   . Aortic aneurysm with dissection ([place])   . Aortic aneurysm, descending ([place])   . Atrial fibrillation ([place])   . Breast CA ([place])   . CHF (congestive heart failure) ([place])   . Hypertension   . Hypomagnesemia, replaced 12/11/2013  . Lymphedema left arm  . Thyroid disease   . UTI (urinary tract infection) 12/11/2013    Patient Active Problem List   Diagnosis Date Noted  . HTN (hypertension) 05/22/2016  . Hyponatremia 05/17/2016  . Hyperkalemia 05/20/2016  . CKD (chronic kidney disease), stage III 04/28/2016  . Acute respiratory failure with hypoxia ([place])  05/08/2016  . Acute on chronic respiratory failure with hypoxia ([place]) 05/11/2016  . Type 1 dissection of thoracic aorta ([place]) 10/07/2015  . Chest pain 10/07/2015  . Chronic atrial fibrillation ([place]) 10/07/2015  . Dementia without behavioral disturbance 10/07/2015  . GERD (gastroesophageal reflux disease) 10/07/2015  . Acute diastolic heart failure, in combination with MR and atrial fib 12/11/2013  . Acute on chronic diastolic CHF (congestive heart failure) ([place]) 12/11/2013  . On continuous oral anticoagulation, Xarelto 12/11/2013  . UTI (urinary tract infection) 12/11/2013  . Hypomagnesemia, replaced 12/11/2013  . Mitral regurgitation 11/17/2013  . Normocytic anemia 11/17/2013  . Atrial fibrillation, 12/10/13 DCCV maintaining SR  10/20/2013  . DOE (dyspnea on exertion) 10/20/2013  . Benign hypertensive heart disease without heart failure 10/20/2013  . Thoracic ascending aortic aneurysm ([place]) 10/20/2013  . Descending thoracic aortic aneurysm ([place]) 10/20/2013  . Malaise and fatigue 10/20/2013  . Hypothyroid 10/20/2013    Past Surgical History:  Procedure Laterality Date  . ABDOMINAL HYSTERECTOMY    . ASCENDING AORTIC ANEURYSM REPAIR    . BREAST LUMPECTOMY    . CARDIOVERSION N/A 12/10/2013   Procedure: CARDIOVERSION   (bedside);  Surgeon: Peter M Martinique, MD;  Location: [facility] OR;  Service: Cardiovascular;  Laterality: N/A;  . DESCENDING AORTIC ANEURYSM REPAIR      OB History    No data available       Home Medications    Prior to Admission medications   Medication Sig Start  Date End Date Taking? Authorizing Provider  acetaminophen (TYLENOL) 500 MG tablet Take 500 mg by mouth every 6 (six) hours as needed for mild pain.    Yes Historical Provider, MD  aspirin EC 81 MG tablet Take 81 mg by mouth every other day.    Yes Historical Provider, MD  bisacodyl (DULCOLAX) 10 MG suppository Place 10 mg rectally as needed for moderate constipation.   Yes Historical Provider, MD  cetirizine  (ZYRTEC) 10 MG tablet Take 10 mg by mouth as needed for allergies.    Yes Historical Provider, MD  flecainide (TAMBOCOR) 100 MG tablet Take 50 mg by mouth 2 (two) times daily.    Yes Historical Provider, MD  fluticasone (FLONASE) 50 MCG/ACT nasal spray Place 1 spray into both nostrils as needed for allergies.    Yes Historical Provider, MD  furosemide (LASIX) 20 MG tablet Take 1 tablet (20 mg total) by mouth daily. 01/14/14  Yes Darlin Coco, MD  labetalol (NORMODYNE) 300 MG tablet Take 150 mg by mouth every 12 (twelve) hours.   Yes Historical Provider, MD  levothyroxine (SYNTHROID, LEVOTHROID) 75 MCG tablet Take 75 mcg by mouth daily before breakfast.   Yes Historical Provider, MD  losartan (COZAAR) 50 MG tablet Take 50 mg by mouth daily.   Yes Historical Provider, MD  magnesium hydroxide (MILK OF MAGNESIA) 400 MG/5ML suspension Take 30 mLs by mouth daily as needed for mild constipation.   Yes Historical Provider, MD  Multiple Vitamin (MULTIVITAMIN WITH MINERALS) TABS tablet Take 1 tablet by mouth daily.   Yes Historical Provider, MD  oxyCODONE (OXY IR/ROXICODONE) 5 MG immediate release tablet Take 5 mg by mouth every 6 (six) hours as needed for severe pain.   Yes Historical Provider, MD  pantoprazole (PROTONIX) 40 MG tablet Take 40 mg by mouth daily.   Yes Historical Provider, MD  senna-docusate (SENOKOT-S) 8.6-50 MG tablet Take 2 tablets by mouth daily as needed for mild constipation.    Yes Historical Provider, MD  Sodium Phosphates (RA SALINE ENEMA) 19-7 GM/118ML ENEM Place 1 each rectally as needed (for constipation).   Yes Historical Provider, MD    Family History No family history on file.  Social History Social History  Substance Use Topics  . Smoking status: Former Research scientist (life sciences)  . Smokeless tobacco: Never Used  . Alcohol use No     Allergies   Patient has no known allergies.   Review of Systems Review of Systems  Constitutional: Negative for fever.  Respiratory: Positive for  shortness of breath and wheezing. Negative for cough.   Cardiovascular: Negative for chest pain and leg swelling.  Gastrointestinal: Negative for abdominal pain, nausea and vomiting.  All other systems reviewed and are negative.    Physical Exam Updated Vital Signs BP (!) 166/120   Pulse 64   Temp 97.8 F (36.6 C)   Resp 17   Ht 5\' 4"  (1.626 m)   Wt 142 lb (64.4 kg)   SpO2 100%   BMI 24.37 kg/m   Physical Exam  Constitutional: She is oriented to person, place, and time.  Awake, alert, elderly, mild tachypnea  HENT:  Head: Normocephalic and atraumatic.  Mild bruising noted under the right eye  Cardiovascular: Normal rate and normal heart sounds.   Irregular rhythm  Pulmonary/Chest: She is in respiratory distress. She has wheezes.  Course or wheezing and rhonchi throughout all lung fields, poor air movement, tachypnea with mild increased work of breathing Bruising noted of the right breast  Abdominal:  Soft. Bowel sounds are normal. There is no tenderness. There is no guarding.  Musculoskeletal:  Trace bilateral lower extremity edema  Neurological: She is alert and oriented to person, place, and time.  Skin: Skin is warm and dry.  Psychiatric: She has a normal mood and affect.  Nursing note and vitals reviewed.    ED Treatments / Results  DIAGNOSTIC STUDIES: Oxygen Saturation is 100% on CPAP, normal by my interpretation.  COORDINATION OF CARE: 1:50 AM-Discussed treatment plan with pt at bedside and pt agreed to plan.   Labs (all labs ordered are listed, but only abnormal results are displayed) Labs Reviewed  CBC WITH DIFFERENTIAL/PLATELET - Abnormal; Notable for the following:       Result Value   RBC 3.14 (*)    Hemoglobin 9.3 (*)    HCT 27.4 (*)    Lymphs Abs 0.4 (*)    All other components within normal limits  BASIC METABOLIC PANEL - Abnormal; Notable for the following:    Sodium 119 (*)    Potassium 5.6 (*)    Chloride 85 (*)    Glucose, Bld 150 (*)     Creatinine, Ser 1.15 (*)    GFR calc non Af Amer 44 (*)    GFR calc Af Amer 51 (*)    All other components within normal limits  BRAIN NATRIURETIC PEPTIDE - Abnormal; Notable for the following:    B Natriuretic Peptide 557.3 (*)    All other components within normal limits  I-STAT ARTERIAL BLOOD GAS, ED - Abnormal; Notable for the following:    pO2, Arterial 241.0 (*)    All other components within normal limits  I-STAT CHEM 8, ED - Abnormal; Notable for the following:    Sodium 118 (*)    Potassium 5.2 (*)    Chloride 85 (*)    Glucose, Bld 123 (*)    Calcium, Ion 1.11 (*)    Hemoglobin 10.5 (*)    HCT 31.0 (*)    All other components within normal limits  OSMOLALITY  OSMOLALITY, URINE  SODIUM, URINE, RANDOM  TROPONIN I  TROPONIN I  TROPONIN I  I-STAT TROPOININ, ED    EKG  EKG Interpretation  Date/Time:  Tuesday May 21 2016 01:52:38 EST Ventricular Rate:  94 PR Interval:    QRS Duration: 133 QT Interval:  415 QTC Calculation: 519 R Axis:   107 Text Interpretation:  Atrial fibrillation IVCD, consider atypical RBBB Probable anteroseptal infarct, old Confirmed by Taevion Sikora  MD, Araf Clugston ([id number]) on 05/18/2016 2:07:38 AM       Radiology Dg Chest Portable 1 View  Result Date: 05/19/2016 CLINICAL DATA:  Difficulty breathing. EXAM: PORTABLE CHEST 1 VIEW COMPARISON:  Chest radiograph 04/16/2016 FINDINGS: Median sternotomy wires and thoracic aortic endograft stent are unchanged. Cardiac silhouette with upper mediastinal widening is also unchanged. Lung aeration is decreased compared to the prior study, particularly at the left lung base. No pneumothorax or pleural effusion. IMPRESSION: Decreased aeration of both lungs, most notably at the lung bases, likely atelectasis. Electronically Signed   By: Ulyses Jarred M.D.   On: 05/16/2016 02:32    Procedures Procedures (including critical care time)  CRITICAL CARE Performed by: Merryl Hacker   Total critical care  time: 40 minutes  Critical care time was exclusive of separately billable procedures and treating other patients.  Critical care was necessary to treat or prevent imminent or life-threatening deterioration.  Critical care was time spent personally by me on the following activities:  development of treatment plan with patient and/or surrogate as well as nursing, discussions with consultants, evaluation of patient's response to treatment, examination of patient, obtaining history from patient or surrogate, ordering and performing treatments and interventions, ordering and review of laboratory studies, ordering and review of radiographic studies, pulse oximetry and re-evaluation of patient's condition.   Medications Ordered in ED Medications  nitroGLYCERIN 50 mg in dextrose 5 % 250 mL (0.2 mg/mL) infusion (not administered)  aspirin EC tablet 81 mg (not administered)  bisacodyl (DULCOLAX) suppository 10 mg (not administered)  magnesium hydroxide (MILK OF MAGNESIA) suspension 30 mL (not administered)  multivitamin with minerals tablet 1 tablet (not administered)  oxyCODONE (Oxy IR/ROXICODONE) immediate release tablet 5 mg (not administered)  RA SALINE ENEMA 19-7 GM/118ML ENEM 1 each (not administered)  acetaminophen (TYLENOL) tablet 500 mg (not administered)  loratadine (CLARITIN) tablet 10 mg (not administered)  fluticasone (FLONASE) 50 MCG/ACT nasal spray 1 spray (not administered)  labetalol (NORMODYNE) tablet 150 mg (not administered)  senna-docusate (Senokot-S) tablet 2 tablet (not administered)  pantoprazole (PROTONIX) EC tablet 40 mg (not administered)  flecainide (TAMBOCOR) tablet 50 mg (not administered)  levothyroxine (SYNTHROID, LEVOTHROID) tablet 75 mcg (not administered)  sodium polystyrene (KAYEXALATE) 15 GM/60ML suspension 30 g (not administered)  ipratropium (ATROVENT) nebulizer solution 0.5 mg (not administered)  albuterol (PROVENTIL) (2.5 MG/3ML) 0.083% nebulizer solution 2.5  mg (not administered)  sodium chloride flush (NS) 0.9 % injection 3 mL (not administered)  sodium chloride flush (NS) 0.9 % injection 3 mL (not administered)  0.9 %  sodium chloride infusion (not administered)  enoxaparin (LOVENOX) injection 40 mg (not administered)  zolpidem (AMBIEN) tablet 5 mg (not administered)  ALPRAZolam (XANAX) tablet 0.25 mg (not administered)  nitroGLYCERIN 50 mg in dextrose 5 % 250 mL (0.2 mg/mL) infusion (15 mcg/min Intravenous Rate/Dose Change 04/28/2016 0301)  ondansetron (ZOFRAN) injection 4 mg (4 mg Intravenous Given 04/29/2016 0348)     Initial Impression / Assessment and Plan / ED Course  I have reviewed the triage vital signs and the nursing notes.  Pertinent labs & imaging results that were available during my care of the patient were reviewed by me and considered in my medical decision making (see chart for details).  Clinical Course     Patient presents with shortness of breath. Ongoing the last 2-3 days. On C Pap upon arrival. Was able to come off and speak with me in short sentences. Denies recent fevers or infectious symptoms. Does have history of CHF. Feels better on BiPAP. This was replaced. Workup notable for hyponatremia with a sodium of 119. Also mild hyperkalemia. No ischemic EKG changes, no QRS widening. She is in atrial fibrillation. Chest x-ray was read as decreased aeration. However, it appears hazy to me and suggestive of pulmonary edema. She does have trace to 1+ lower extremity edema. She was started on a nitroglycerin drip for afterload reduction. We'll hold Lasix and discussed with the hospitalist. No history of COPD.  Discussed with the admitting hospitalist. He will evaluate for options regarding diuresis.  After history, exam, and medical workup I feel the patient has been appropriately medically screened and is safe for discharge home. Pertinent diagnoses were discussed with the patient. Patient was given return precautions.   Final  Clinical Impressions(s) / ED Diagnoses   Final diagnoses:  SOB (shortness of breath)  Acute diastolic congestive heart failure (HCC)    New Prescriptions New Prescriptions   No medications on file   I personally performed the services described  in this documentation, which was scribed in my presence. The recorded information has been reviewed and is accurate.     Merryl Hacker, MD 05/17/2016 [phone]

## 2016-05-21 NOTE — ED Notes (Signed)
RT at the bedside.

## 2016-05-22 ENCOUNTER — Other Ambulatory Visit (HOSPITAL_COMMUNITY): Payer: Medicare Other

## 2016-05-22 ENCOUNTER — Inpatient Hospital Stay (HOSPITAL_COMMUNITY): Payer: Medicare Other

## 2016-05-22 DIAGNOSIS — I5031 Acute diastolic (congestive) heart failure: Secondary | ICD-10-CM

## 2016-05-22 DIAGNOSIS — K219 Gastro-esophageal reflux disease without esophagitis: Secondary | ICD-10-CM

## 2016-05-22 DIAGNOSIS — E875 Hyperkalemia: Secondary | ICD-10-CM

## 2016-05-22 DIAGNOSIS — J9601 Acute respiratory failure with hypoxia: Secondary | ICD-10-CM

## 2016-05-22 DIAGNOSIS — R0602 Shortness of breath: Secondary | ICD-10-CM

## 2016-05-22 DIAGNOSIS — R061 Stridor: Secondary | ICD-10-CM

## 2016-05-22 DIAGNOSIS — J441 Chronic obstructive pulmonary disease with (acute) exacerbation: Secondary | ICD-10-CM

## 2016-05-22 DIAGNOSIS — I712 Thoracic aortic aneurysm, without rupture: Secondary | ICD-10-CM

## 2016-05-22 DIAGNOSIS — J9621 Acute and chronic respiratory failure with hypoxia: Principal | ICD-10-CM

## 2016-05-22 DIAGNOSIS — E039 Hypothyroidism, unspecified: Secondary | ICD-10-CM

## 2016-05-22 LAB — BLOOD GAS, ARTERIAL
Acid-base deficit: 0.3 mmol/L (ref 0.0–2.0)
BICARBONATE: 24.6 mmol/L (ref 20.0–28.0)
DRAWN BY: 462031
Delivery systems: POSITIVE
Expiratory PAP: 8
FIO2: 80
INSPIRATORY PAP: 12
O2 SAT: 99.7 %
PO2 ART: 253 mmHg — AB (ref 83.0–108.0)
Patient temperature: 98.6
pCO2 arterial: 46.2 mmHg (ref 32.0–48.0)
pH, Arterial: 7.347 — ABNORMAL LOW (ref 7.350–7.450)

## 2016-05-22 LAB — CBC
HEMATOCRIT: 29.8 % — AB (ref 36.0–46.0)
Hemoglobin: 9.9 g/dL — ABNORMAL LOW (ref 12.0–15.0)
MCH: 29.5 pg (ref 26.0–34.0)
MCHC: 33.2 g/dL (ref 30.0–36.0)
MCV: 88.7 fL (ref 78.0–100.0)
Platelets: 216 10*3/uL (ref 150–400)
RBC: 3.36 MIL/uL — ABNORMAL LOW (ref 3.87–5.11)
RDW: 15.3 % (ref 11.5–15.5)
WBC: 7.6 10*3/uL (ref 4.0–10.5)

## 2016-05-22 LAB — IRON AND TIBC
Iron: 24 ug/dL — ABNORMAL LOW (ref 28–170)
Saturation Ratios: 7 % — ABNORMAL LOW (ref 10.4–31.8)
TIBC: 339 ug/dL (ref 250–450)
UIBC: 315 ug/dL

## 2016-05-22 LAB — BASIC METABOLIC PANEL
Anion gap: 9 (ref 5–15)
BUN: 14 mg/dL (ref 6–20)
CO2: 27 mmol/L (ref 22–32)
Calcium: 9 mg/dL (ref 8.9–10.3)
Chloride: 84 mmol/L — ABNORMAL LOW (ref 101–111)
Creatinine, Ser: 0.98 mg/dL (ref 0.44–1.00)
GFR calc Af Amer: >60 mL/min (ref >60)
GFR, EST NON AFRICAN AMERICAN: 54 mL/min — AB (ref >60)
GLUCOSE: 162 mg/dL — AB (ref 65–99)
POTASSIUM: 5.1 mmol/L (ref 3.5–5.1)
Sodium: 120 mmol/L — ABNORMAL LOW (ref 135–145)

## 2016-05-22 LAB — URINALYSIS, ROUTINE W REFLEX MICROSCOPIC
Bilirubin Urine: NEGATIVE
GLUCOSE, UA: NEGATIVE mg/dL
HGB URINE DIPSTICK: NEGATIVE
KETONES UR: NEGATIVE mg/dL
LEUKOCYTES UA: NEGATIVE
Nitrite: NEGATIVE
PROTEIN: 100 mg/dL — AB
Specific Gravity, Urine: 1.019 (ref 1.005–1.030)
pH: 5 (ref 5.0–8.0)

## 2016-05-22 LAB — TSH
TSH: 0.323 u[IU]/mL — AB (ref 0.350–4.500)
TSH: 0.377 u[IU]/mL (ref 0.350–4.500)

## 2016-05-22 LAB — RETICULOCYTES
RBC.: 3.36 MIL/uL — ABNORMAL LOW (ref 3.87–5.11)
RETIC COUNT ABSOLUTE: 117.6 10*3/uL (ref 19.0–186.0)
RETIC CT PCT: 3.5 % — AB (ref 0.4–3.1)

## 2016-05-22 LAB — OSMOLALITY: Osmolality: 265 mOsm/kg — ABNORMAL LOW (ref 275–295)

## 2016-05-22 LAB — URINE MICROSCOPIC-ADD ON

## 2016-05-22 LAB — OSMOLALITY, URINE: Osmolality, Ur: 515 mOsm/kg (ref 300–900)

## 2016-05-22 LAB — FERRITIN: FERRITIN: 185 ng/mL (ref 11–307)

## 2016-05-22 LAB — CREATININE, URINE, RANDOM: Creatinine, Urine: 104.95 mg/dL

## 2016-05-22 LAB — SODIUM, URINE, RANDOM: Sodium, Ur: <10 mmol/L

## 2016-05-22 LAB — VITAMIN B12: Vitamin B-12: 1327 pg/mL — ABNORMAL HIGH (ref 180–914)

## 2016-05-22 LAB — FOLATE: Folate: 44.7 ng/mL (ref >5.9)

## 2016-05-22 LAB — MRSA PCR SCREENING: MRSA BY PCR: NEGATIVE

## 2016-05-22 MED ORDER — LORAZEPAM 2 MG/ML IJ SOLN
1.0000 mg | INTRAMUSCULAR | Status: DC | PRN
  Administered 2016-05-23 – 2016-05-24 (×2): 1 mg via INTRAVENOUS
  Filled 2016-05-22 (×2): qty 1

## 2016-05-22 MED ORDER — LORAZEPAM 2 MG/ML IJ SOLN
0.5000 mg | Freq: Once | INTRAMUSCULAR | Status: AC
  Administered 2016-05-22: 0.5 mg via INTRAVENOUS
  Filled 2016-05-22: qty 1

## 2016-05-22 MED ORDER — MORPHINE SULFATE (PF) 2 MG/ML IV SOLN
1.0000 mg | INTRAVENOUS | Status: DC | PRN
  Administered 2016-05-22 – 2016-05-23 (×5): 1 mg via INTRAVENOUS
  Filled 2016-05-22 (×5): qty 1

## 2016-05-22 MED ORDER — FUROSEMIDE 10 MG/ML IJ SOLN
40.0000 mg | Freq: Once | INTRAMUSCULAR | Status: DC
  Filled 2016-05-22: qty 4

## 2016-05-22 MED ORDER — IPRATROPIUM-ALBUTEROL 0.5-2.5 (3) MG/3ML IN SOLN
3.0000 mL | Freq: Four times a day (QID) | RESPIRATORY_TRACT | Status: DC
  Administered 2016-05-22 – 2016-05-23 (×6): 3 mL via RESPIRATORY_TRACT
  Filled 2016-05-22 (×8): qty 3

## 2016-05-22 MED ORDER — FAMOTIDINE 20 MG PO TABS: 20.0000 mg | ORAL_TABLET | Freq: Every day | ORAL | Status: DC

## 2016-05-22 MED ORDER — FUROSEMIDE 10 MG/ML IJ SOLN
80.0000 mg | Freq: Two times a day (BID) | INTRAMUSCULAR | Status: DC
  Administered 2016-05-22 – 2016-05-23 (×2): 80 mg via INTRAVENOUS
  Filled 2016-05-22 (×2): qty 8

## 2016-05-22 MED ORDER — RACEPINEPHRINE HCL 2.25 % IN NEBU
0.5000 mL | INHALATION_SOLUTION | Freq: Once | RESPIRATORY_TRACT | Status: AC
  Administered 2016-05-22: 0.5 mL via RESPIRATORY_TRACT
  Filled 2016-05-22: qty 0.5

## 2016-05-22 NOTE — Progress Notes (Signed)
Patient ID: Beth [name], female   DOB: 05-08-1936, 80 y.o.   MRN: [id number]        [address]       [place] [address]             [phone]     Asked to see patient now in respiratory distress with long history of type 1 endo  Leak following thoracic stent graft, previous ascending dissection repair and debranching of cerebral  Vessels. She was evaluated by Dr Lunette Stands at [facility] and Dr Sammuel Hines (see below) for enlarging type 1 endo leak /pseudo aneurysm . She was not considered an operative candidate due to fragility and dementa.   Per dr Sammuel Hines Medical management: with continued smoking cessation, hyperlipidemia management and hypertension management. Rupture risk in the next year is 20% at a minimum however we need to see her prior scans for comparison purposes to fully evaluate the aneurysm growth.   Open repair: procedure explained by Dr. Sammuel Hines. Average hospital recovery of 2 weeks, overall recovery of 9 months. We would repair the thoracic portion first and the abdomen at a later time. Risks include: paraplegia 8-15%, heart 5-10%, lung 10%, kidney 20-30%, GI blood flow 1-2%, BLE blood flow 1-2%, stroke 5%. [name] states she is not interested in open repair at this time.  Endovascular repair: procedure explained by Dr. Sammuel Hines. Would likely need a staged procedure- first with a proximal extension down to just above the level of the celiac, followed 6 weeks later with a 4 vessel fenestrated device to seal distally. The first procedure would have an average recovery of 1-4 days and the second procedure with an average recovery of 1-2 weeks. She was informed that the device is not FDA approved and takes a minimum of 3-4 months to arrive. We will obtain the following studies for further evaluation: Renal mesenteric duplex, carotid artery duplex and BLE arterial duplex.  [name] and her son will discuss the treatment options and will contact me with their decision  regarding how they would like to proceed. [name] gave permission for her medical information to be shared with her son, Ronalee Belts.    I have seen the patient and reviewed previous films and the limited study of the neck done tonight. Her stridor is likely due to tracheal compression from increasing aortic aneurysm  of arch and  descending aorta .  I concur with Dr Lunette Stands knowledge of the patients problem and would recommend comfort care only.  I have seen and examined Beth [name] and agree with the above assessment  and plan.  Discussed case with Dr Lebron Conners MD [facility] [phone] Office [phone] 05/22/2016 6:19 PM

## 2016-05-22 NOTE — Progress Notes (Addendum)
On heparin gtt, but upon arrival from ED-IV catheter was out of place. Re-started new IV and that's when patient was unresponsive. Patient became lethargic and would not move much with a sternal rub. Pt very pale. Would look at [place] when we called her name but eyes rolled back. Did receive ambien and Xanax in ED before coming to [phone]. Paged NP Schorr. Paged RR nurse. STAT ABG done. On BIPAP, Oxygen was dropping to 80s. Atrial fibrillation, HR spiked up to 130s, RR 30s. BP 166/80.  Within 10 minutes, patient was responsive. Knew who I was. More alert. Able to answer questions even on BIPAP mask. Is having a full conversation with me. Pt stated she could hear [place] calling her name but could not answer.  NP schorr called back. I told her what happened. Pt is stable. Asked to page her with ABG results. Still on nitro gtt. O2 99% RR 20 BP 117/77. Will continue to monitor closely.

## 2016-05-22 NOTE — Consult Note (Signed)
ENT CONSULT:  Reason for Consult: Stridor, mild respiratory distress Referring Physician: Critical care service  Beth [name] is an 80 y.o. female.  HPI: Patient admitted via emergency department on 11/28 for increased symptoms of shortness of breath and mild respiratory distress. Patient with a significant history of hypertension, atrial fibrillation and aortic aneurysm. Patient evaluated by critical care/pulmonary and ENT consult requested for possible stridor. Patient comfortably on full face CPAP, no airway distress and no evidence of stridor. Normal voice without hoarseness or distress off CPAP.  Past Medical History:  Diagnosis Date  . Acute diastolic heart failure, in combination with MR and atrial fib 12/11/2013  . Acute renal failure (ARF) ([place])   . Aortic aneurysm with dissection ([place])   . Aortic aneurysm, descending ([place])   . Atrial fibrillation ([place])   . Breast CA ([place])   . CHF (congestive heart failure) ([place])   . Hypertension   . Hypomagnesemia, replaced 12/11/2013  . Lymphedema left arm  . Thyroid disease   . UTI (urinary tract infection) 12/11/2013    Past Surgical History:  Procedure Laterality Date  . ABDOMINAL HYSTERECTOMY    . ASCENDING AORTIC ANEURYSM REPAIR    . BREAST LUMPECTOMY    . CARDIOVERSION N/A 12/10/2013   Procedure: CARDIOVERSION   (bedside);  Surgeon: Peter M Martinique, MD;  Location: [facility] OR;  Service: Cardiovascular;  Laterality: N/A;  . DESCENDING AORTIC ANEURYSM REPAIR      Family History  Problem Relation Age of Onset  . Heart disease Mother   . Heart disease Father   . Heart attack Sister   . Heart attack Brother     Social History:  reports that she has quit smoking. She has never used smokeless tobacco. She reports that she does not drink alcohol or use drugs.  Allergies: No Known Allergies  Medications: I have reviewed the patient's current medications.  Results for orders placed or performed during the hospital encounter of 05/11/2016  (from the past 48 hour(s))  CBC with Differential     Status: Abnormal   Collection Time: 05/20/2016  2:10 AM  Result Value Ref Range   WBC 6.5 4.0 - 10.5 K/uL   RBC 3.14 (L) 3.87 - 5.11 MIL/uL   Hemoglobin 9.3 (L) 12.0 - 15.0 g/dL   HCT 27.4 (L) 36.0 - 46.0 %   MCV 87.3 78.0 - 100.0 fL   MCH 29.6 26.0 - 34.0 pg   MCHC 33.9 30.0 - 36.0 g/dL   RDW 14.9 11.5 - 15.5 %   Platelets 199 150 - 400 K/uL   Neutrophils Relative % 86 %   Neutro Abs 5.6 1.7 - 7.7 K/uL   Lymphocytes Relative 6 %   Lymphs Abs 0.4 (L) 0.7 - 4.0 K/uL   Monocytes Relative 8 %   Monocytes Absolute 0.5 0.1 - 1.0 K/uL   Eosinophils Relative 0 %   Eosinophils Absolute 0.0 0.0 - 0.7 K/uL   Basophils Relative 0 %   Basophils Absolute 0.0 0.0 - 0.1 K/uL  Basic metabolic panel     Status: Abnormal   Collection Time: 04/27/2016  2:10 AM  Result Value Ref Range   Sodium 119 (LL) 135 - 145 mmol/L    Comment: REPEATED TO VERIFY CRITICAL RESULT CALLED TO, READ BACK BY AND VERIFIED WITH: MCCORD S,RN 04/25/2016 0257 [id number]    Potassium 5.6 (H) 3.5 - 5.1 mmol/L   Chloride 85 (L) 101 - 111 mmol/L   CO2 23 22 - 32 mmol/L  Glucose, Bld 150 (H) 65 - 99 mg/dL   BUN 17 6 - 20 mg/dL   Creatinine, Ser 1.15 (H) 0.44 - 1.00 mg/dL   Calcium 9.0 8.9 - 10.3 mg/dL   GFR calc non Af Amer 44 (L) >60 mL/min   GFR calc Af Amer 51 (L) >60 mL/min    Comment: (NOTE) The eGFR has been calculated using the CKD EPI equation. This calculation has not been validated in all clinical situations. eGFR's persistently <60 mL/min signify possible Chronic Kidney Disease.    Anion gap 11 5 - 15  Brain natriuretic peptide     Status: Abnormal   Collection Time: 05/16/2016  2:10 AM  Result Value Ref Range   B Natriuretic Peptide 557.3 (H) 0.0 - 100.0 pg/mL  I-Stat Troponin, ED (not at [facility])     Status: None   Collection Time: 05/14/2016  2:21 AM  Result Value Ref Range   Troponin i, poc 0.00 0.00 - 0.08 ng/mL   Comment 3            Comment: Due to the  release kinetics of cTnI, a negative result within the first hours of the onset of symptoms does not rule out myocardial infarction with certainty. If myocardial infarction is still suspected, repeat the test at appropriate intervals.   I-Stat Arterial Blood Gas, ED - (order at [facility] and [facility] only)     Status: Abnormal   Collection Time: 05/04/2016  4:08 AM  Result Value Ref Range   pH, Arterial 7.401 7.350 - 7.450   pCO2 arterial 38.6 32.0 - 48.0 mmHg   pO2, Arterial 241.0 (H) 83.0 - 108.0 mmHg   Bicarbonate 24.1 20.0 - 28.0 mmol/L   TCO2 25 0 - 100 mmol/L   O2 Saturation 100.0 %   Acid-base deficit 1.0 0.0 - 2.0 mmol/L   Patient temperature 97.8 F    Collection site RADIAL, ALLEN'S TEST ACCEPTABLE    Drawn by RT    Sample type ARTERIAL   I-Stat Chem 8, ED     Status: Abnormal   Collection Time: 05/04/2016  4:25 AM  Result Value Ref Range   Sodium 118 (LL) 135 - 145 mmol/L   Potassium 5.2 (H) 3.5 - 5.1 mmol/L   Chloride 85 (L) 101 - 111 mmol/L   BUN 19 6 - 20 mg/dL   Creatinine, Ser 1.00 0.44 - 1.00 mg/dL   Glucose, Bld 123 (H) 65 - 99 mg/dL   Calcium, Ion 1.11 (L) 1.15 - 1.40 mmol/L   TCO2 23 0 - 100 mmol/L   Hemoglobin 10.5 (L) 12.0 - 15.0 g/dL   HCT 31.0 (L) 36.0 - 46.0 %   Comment NOTIFIED PHYSICIAN   Osmolality     Status: Abnormal   Collection Time: 05/15/2016  6:34 AM  Result Value Ref Range   Osmolality 254 (L) 275 - 295 mOsm/kg  Troponin I     Status: None   Collection Time: 05/22/2016  6:34 AM  Result Value Ref Range   Troponin I <0.03 <0.03 ng/mL  Osmolality, urine     Status: None   Collection Time: 05/01/2016  9:01 AM  Result Value Ref Range   Osmolality, Ur 587 300 - 900 mOsm/kg  Sodium, urine, random     Status: None   Collection Time: 05/10/2016  9:01 AM  Result Value Ref Range   Sodium, Ur 11 mmol/L  Troponin I     Status: None   Collection Time: 05/06/2016 11:21 AM  Result  Value Ref Range   Troponin I <0.03 <0.03 ng/mL  Troponin I     Status: None   Collection  Time: 05/07/2016  6:35 PM  Result Value Ref Range   Troponin I <0.03 <0.03 ng/mL  Vitamin B12     Status: Abnormal   Collection Time: 05/22/16 12:51 AM  Result Value Ref Range   Vitamin B-12 1,327 (H) 180 - 914 pg/mL    Comment: (NOTE) This assay is not validated for testing neonatal or myeloproliferative syndrome specimens for Vitamin B12 levels.   Folate     Status: None   Collection Time: 05/22/16 12:51 AM  Result Value Ref Range   Folate 44.7 >5.9 ng/mL    Comment: RESULTS CONFIRMED BY MANUAL DILUTION  Iron and TIBC     Status: Abnormal   Collection Time: 05/22/16 12:51 AM  Result Value Ref Range   Iron 24 (L) 28 - 170 ug/dL   TIBC 339 250 - 450 ug/dL   Saturation Ratios 7 (L) 10.4 - 31.8 %   UIBC 315 ug/dL  Ferritin     Status: None   Collection Time: 05/22/16 12:51 AM  Result Value Ref Range   Ferritin 185 11 - 307 ng/mL  Reticulocytes     Status: Abnormal   Collection Time: 05/22/16 12:51 AM  Result Value Ref Range   Retic Ct Pct 3.5 (H) 0.4 - 3.1 %   RBC. 3.36 (L) 3.87 - 5.11 MIL/uL   Retic Count, Manual 117.6 19.0 - 186.0 K/uL  TSH     Status: None   Collection Time: 05/22/16 12:51 AM  Result Value Ref Range   TSH 0.377 0.350 - 4.500 uIU/mL    Comment: Performed by a 3rd Generation assay with a functional sensitivity of <=0.01 uIU/mL.  Basic metabolic panel     Status: Abnormal   Collection Time: 05/22/16 12:51 AM  Result Value Ref Range   Sodium 120 (L) 135 - 145 mmol/L   Potassium 5.1 3.5 - 5.1 mmol/L   Chloride 84 (L) 101 - 111 mmol/L   CO2 27 22 - 32 mmol/L   Glucose, Bld 162 (H) 65 - 99 mg/dL   BUN 14 6 - 20 mg/dL   Creatinine, Ser 0.98 0.44 - 1.00 mg/dL   Calcium 9.0 8.9 - 10.3 mg/dL   GFR calc non Af Amer 54 (L) >60 mL/min   GFR calc Af Amer >60 >60 mL/min    Comment: (NOTE) The eGFR has been calculated using the CKD EPI equation. This calculation has not been validated in all clinical situations. eGFR's persistently <60 mL/min signify possible  Chronic Kidney Disease.    Anion gap 9 5 - 15  CBC     Status: Abnormal   Collection Time: 05/22/16 12:51 AM  Result Value Ref Range   WBC 7.6 4.0 - 10.5 K/uL   RBC 3.36 (L) 3.87 - 5.11 MIL/uL   Hemoglobin 9.9 (L) 12.0 - 15.0 g/dL   HCT 29.8 (L) 36.0 - 46.0 %   MCV 88.7 78.0 - 100.0 fL   MCH 29.5 26.0 - 34.0 pg   MCHC 33.2 30.0 - 36.0 g/dL   RDW 15.3 11.5 - 15.5 %   Platelets 216 150 - 400 K/uL  Blood gas, arterial     Status: Abnormal   Collection Time: 05/22/16  1:08 AM  Result Value Ref Range   FIO2 80.00    Delivery systems BILEVEL POSITIVE AIRWAY PRESSURE    Inspiratory PAP 12  Expiratory PAP 8    pH, Arterial 7.347 (L) 7.350 - 7.450   pCO2 arterial 46.2 32.0 - 48.0 mmHg   pO2, Arterial 253 (H) 83.0 - 108.0 mmHg   Bicarbonate 24.6 20.0 - 28.0 mmol/L   Acid-base deficit 0.3 0.0 - 2.0 mmol/L   O2 Saturation 99.7 %   Patient temperature 98.6    Collection site RIGHT RADIAL    Drawn by [id number]    Sample type ARTERIAL    Allens test (pass/fail) PASS PASS  MRSA PCR Screening     Status: None   Collection Time: 05/22/16  1:44 AM  Result Value Ref Range   MRSA by PCR NEGATIVE NEGATIVE    Comment:        The GeneXpert MRSA Assay (FDA approved for NASAL specimens only), is one component of a comprehensive MRSA colonization surveillance program. It is not intended to diagnose MRSA infection nor to guide or monitor treatment for MRSA infections.   TSH     Status: Abnormal   Collection Time: 05/22/16 12:37 PM  Result Value Ref Range   TSH 0.323 (L) 0.350 - 4.500 uIU/mL    Comment: Performed by a 3rd Generation assay with a functional sensitivity of <=0.01 uIU/mL.  Osmolality     Status: Abnormal   Collection Time: 05/22/16  3:06 PM  Result Value Ref Range   Osmolality 265 (L) 275 - 295 mOsm/kg  Urinalysis, Routine w reflex microscopic (not at [facility])     Status: Abnormal   Collection Time: 05/22/16  3:21 PM  Result Value Ref Range   Color, Urine AMBER (A) YELLOW     Comment: BIOCHEMICALS MAY BE AFFECTED BY COLOR   APPearance CLEAR CLEAR   Specific Gravity, Urine 1.019 1.005 - 1.030   pH 5.0 5.0 - 8.0   Glucose, UA NEGATIVE NEGATIVE mg/dL   Hgb urine dipstick NEGATIVE NEGATIVE   Bilirubin Urine NEGATIVE NEGATIVE   Ketones, ur NEGATIVE NEGATIVE mg/dL   Protein, ur 100 (A) NEGATIVE mg/dL   Nitrite NEGATIVE NEGATIVE   Leukocytes, UA NEGATIVE NEGATIVE  Sodium, urine, random     Status: None   Collection Time: 05/22/16  3:21 PM  Result Value Ref Range   Sodium, Ur <10 mmol/L    Comment: REPEATED TO VERIFY  Creatinine, urine, random     Status: None   Collection Time: 05/22/16  3:21 PM  Result Value Ref Range   Creatinine, Urine 104.95 mg/dL  Osmolality, urine     Status: None   Collection Time: 05/22/16  3:21 PM  Result Value Ref Range   Osmolality, Ur 515 300 - 900 mOsm/kg  Urine microscopic-add on     Status: Abnormal   Collection Time: 05/22/16  3:21 PM  Result Value Ref Range   Squamous Epithelial / LPF 0-5 (A) NONE SEEN   WBC, UA 0-5 0 - 5 WBC/hpf   RBC / HPF 0-5 0 - 5 RBC/hpf   Bacteria, UA FEW (A) NONE SEEN   Casts HYALINE CASTS (A) NEGATIVE    Comment: GRANULAR CAST   Urine-Other AMORPHOUS URATES/PHOSPHATES     Comment: MUCOUS PRESENT    Ct Soft Tissue Neck Wo Contrast  Result Date: 05/22/2016 CLINICAL DATA:  Stridor. Wheezing and stridor when night using BiPAP. EXAM: CT NECK WITHOUT CONTRAST TECHNIQUE: Multidetector CT imaging of the neck was performed following the standard protocol without intravenous contrast. COMPARISON:  CT of the face 06/29/2015 FINDINGS: Pharynx and larynx: No focal mucosal or submucosal lesions are present.  The airway is patent. Vocal cords are midline and symmetric. No focal mucosal or submucosal lesions are present. The parapharyngeal fat is clear. Salivary glands: Submandibular and parotid glands are within normal limits bilaterally. Thyroid: Within normal limits. Lymph nodes: No significant adenopathy is  present. Vascular: There is significant interval enlargement of the previously noted aortic aneurysm. The aneurysm now measures 6.4 cm. There is expansion of the aneurysm sac across the midline which results in mass effect on the distal trachea, carina, and right and left mainstem bronchi. High-density material within the expanded aneurysm may represent blood products. Limited intracranial: Within normal limits. Visualized orbits: Unremarkable. Mastoids and visualized paranasal sinuses: Clear Skeleton: Degenerative changes of the cervical spine are most pronounced at C5-6, stable compared to prior exams. Upper chest: The lung apices are clear. IMPRESSION: 1. Significant increase in size of previously treated thoracic aortic aneurysm suggesting further endoleak or contained rupture. The aneurysm no expands cross midline and results in significant mass effect on the distal carina and proximal right and left mainstem bronchi. The distal aspect of the aneurysm is not imaged. 2. The airway in the neck is unremarkable. These results were called by telephone at the time of interpretation on 05/22/2016 at 4:53 pm to Dr. Ree Kida, who verbally acknowledged these results. Electronically Signed   By: San Morelle M.D.   On: 05/22/2016 16:53   Dg Chest Port 1 View  Result Date: 05/22/2016 CLINICAL DATA:  COPD, shortness of breath, hypertension, CHF, atrial fibrillation EXAM: PORTABLE CHEST 1 VIEW COMPARISON:  Portable exam 0645 hours compared to 04/26/2016 FINDINGS: Enlargement of cardiac silhouette post median sternotomy. Aneurysmal and tortuous thoracic aorta post endoluminal stenting. Pulmonary vascularity normal. Peribronchial thickening with minimal bibasilar atelectasis. No gross infiltrate, pleural effusion or pneumothorax. Bones demineralized. IMPRESSION: Enlargement of cardiac silhouette with evidence of prior aortic endoluminal stenting for aneurysm. Bronchitic changes with minimal bibasilar atelectasis.  Electronically Signed   By: Lavonia Dana M.D.   On: 05/22/2016 07:58   Dg Chest Portable 1 View  Result Date: 05/05/2016 CLINICAL DATA:  Difficulty breathing. EXAM: PORTABLE CHEST 1 VIEW COMPARISON:  Chest radiograph 04/16/2016 FINDINGS: Median sternotomy wires and thoracic aortic endograft stent are unchanged. Cardiac silhouette with upper mediastinal widening is also unchanged. Lung aeration is decreased compared to the prior study, particularly at the left lung base. No pneumothorax or pleural effusion. IMPRESSION: Decreased aeration of both lungs, most notably at the lung bases, likely atelectasis. Electronically Signed   By: Ulyses Jarred M.D.   On: 05/01/2016 02:32    ROS:ROS 12 systems reviewed and negative except as stated in HPI   Blood pressure (!) 136/97, pulse 97, temperature 98.2 F (36.8 C), temperature source Axillary, resp. rate (!) 30, height 5' 4" (1.626 m), weight 71.8 kg (158 lb 3.2 oz), SpO2 100 %.  PHYSICAL EXAM: General appearance - alert, well appearing, and in no distress Nose - dry anterior nasal passageway with crusting, no purulent discharge or evidence of infection Mouth - mucous membranes moist, pharynx normal without lesions Neck - supple, no significant adenopathy Chest - clear to auscultation, no wheezes, rales or rhonchi, symmetric air entry Heart - normal rate and regular rhythm  Procedure Note: Flexible Laryngoscopy  Risks/benefits and possible complications were discussed in detail. Patient understands and agrees to proceed with procedure.   Procedure: 4 mm flexible laryngoscope inserted through the nasal passageway with minimal discomfort. Nasal cavity and nasopharynx patent without discharge, mass or polyp. Normal base of tongue and supraglottis, normal vocal  cord mobility. No evidence of vocal cord paralysis, mass or lesion. Normal hypopharynx without evidence of mass or aspiration.  Patient tolerated procedure without complication or  difficulty.  Early Chars. Wilburn Cornelia, M.D.   Studies Reviewed: None  Assessment/Plan: Patient with history of respiratory distress and mild stridor, stable on current CPAP. Concerns raised regarding possible airway issues. Possible laryngoscopy performed at bedside shows no evidence of vocal cord paralysis or weakness, no nodule, mass or polyp. Patient has normal patent appearing airway. Recommend continued workup through medical service and CCM. Monitor for additional concerns and reconsult as needed.   Schneider,  05/22/2016, 5:14 PM

## 2016-05-22 NOTE — Progress Notes (Signed)
Patient transported to CT and back to room 3W35 without any apparent complications. RT will continue to monitor.

## 2016-05-22 NOTE — Consult Note (Signed)
Name: Beth [name] MRN: [id number] DOB: 1936-05-15    ADMISSION DATE:  05/13/2016 CONSULTATION DATE:  05/22/16  REFERRING MD :  Dr. Ree Kida   CHIEF COMPLAINT:  COPD / Possible Stridor    HISTORY OF PRESENT ILLNESS:  80 y/o F, former smoker (quit 1977, smoked 1/2 ppd x10 years), with PMH of breast cancer with lymphedema in the left arm, HTN, AF (not on anticoagulation due to endoleak 09/2015), thoracic aortic aneurysm s/p percutaneous repair/endograft in 2010 at [facility], CHF, CKD III and hypothyroidism who presented to [facility] on 11/28 via EMS with reports of SOB.    EMS found the patient to have respiratory distress, speaking short sentences.  She was treated with CPAP, nitro and albuterol.  The patient reports she has felt tired for one month.  She typically walks frequently during the week but has been unable to do so in the last month.  Also, she has noted increased SOB over the past week.  She has noted a dry / hacking cough.  Denies fevers, chills, chest pain, pain with inspiration, n/v/d.      ER work up notable for increased work of breathing on BiPAP.  ABG wnl, Na 118, K 5.2, negative troponin, WBC 6.5, Hgb 9.3, BNP 557.  Urine Osmol 587, Urine Na 11.  CXR without acute infiltrate / edema.  The patient was admitted for further evaluation.  In the early hours of 11/29, she was found to be hypoxic / altered while on BiPAP.  RRT was called for assessment. O2 was increased per RT and the patient returned to baseline.   ABG at that time > 7.347 / 46 / 253 / 24.  Primary MD noted increased wheezing and patient was treated with racemic epi 11/29 am.  PCCM consulted for evaluation.     PAST MEDICAL HISTORY :   has a past medical history of Acute diastolic heart failure, in combination with MR and atrial fib (12/11/2013); Acute renal failure (ARF) ([place]); Aortic aneurysm with dissection ([place]); Aortic aneurysm, descending ([place]); Atrial fibrillation ([place]); Breast CA ([place]); CHF (congestive heart failure)  ([place]); Hypertension; Hypomagnesemia, replaced (12/11/2013); Lymphedema (left arm); Thyroid disease; and UTI (urinary tract infection) (12/11/2013).   has a past surgical history that includes Ascending aortic aneurysm repair; Descending aortic aneurysm repair; Breast lumpectomy; Abdominal hysterectomy; and Cardioversion (N/A, 12/10/2013).  Prior to Admission medications   Medication Sig Start Date End Date Taking? Authorizing Provider  acetaminophen (TYLENOL) 500 MG tablet Take 500 mg by mouth every 6 (six) hours as needed for mild pain.    Yes Historical Provider, MD  aspirin EC 81 MG tablet Take 81 mg by mouth every other day.    Yes Historical Provider, MD  bisacodyl (DULCOLAX) 10 MG suppository Place 10 mg rectally as needed for moderate constipation.   Yes Historical Provider, MD  cetirizine (ZYRTEC) 10 MG tablet Take 10 mg by mouth as needed for allergies.    Yes Historical Provider, MD  flecainide (TAMBOCOR) 100 MG tablet Take 50 mg by mouth 2 (two) times daily.    Yes Historical Provider, MD  fluticasone (FLONASE) 50 MCG/ACT nasal spray Place 1 spray into both nostrils as needed for allergies.    Yes Historical Provider, MD  furosemide (LASIX) 20 MG tablet Take 1 tablet (20 mg total) by mouth daily. 01/14/14  Yes Darlin Coco, MD  labetalol (NORMODYNE) 300 MG tablet Take 150 mg by mouth every 12 (twelve) hours.   Yes Historical Provider, MD  levothyroxine (SYNTHROID,  LEVOTHROID) 75 MCG tablet Take 75 mcg by mouth daily before breakfast.   Yes Historical Provider, MD  losartan (COZAAR) 50 MG tablet Take 50 mg by mouth daily.   Yes Historical Provider, MD  magnesium hydroxide (MILK OF MAGNESIA) 400 MG/5ML suspension Take 30 mLs by mouth daily as needed for mild constipation.   Yes Historical Provider, MD  Multiple Vitamin (MULTIVITAMIN WITH MINERALS) TABS tablet Take 1 tablet by mouth daily.   Yes Historical Provider, MD  oxyCODONE (OXY IR/ROXICODONE) 5 MG immediate release tablet Take 5 mg  by mouth every 6 (six) hours as needed for severe pain.   Yes Historical Provider, MD  pantoprazole (PROTONIX) 40 MG tablet Take 40 mg by mouth daily.   Yes Historical Provider, MD  senna-docusate (SENOKOT-S) 8.6-50 MG tablet Take 2 tablets by mouth daily as needed for mild constipation.    Yes Historical Provider, MD  Sodium Phosphates (RA SALINE ENEMA) 19-7 GM/118ML ENEM Place 1 each rectally as needed (for constipation).   Yes Historical Provider, MD    No Known Allergies  FAMILY HISTORY:  family history includes Heart attack in her brother and sister; Heart disease in her father and mother.  SOCIAL HISTORY:  reports that she has quit smoking. She has never used smokeless tobacco. She reports that she does not drink alcohol or use drugs.  REVIEW OF SYSTEMS:  POSITIVES IN BOLD Constitutional: Negative for fever, chills, weight loss, malaise/fatigue and diaphoresis.  HENT: Negative for hearing loss, ear pain, nosebleeds, congestion, sore throat, neck pain, tinnitus and ear discharge.   Eyes: Negative for blurred vision, double vision, photophobia, pain, discharge and redness.  Respiratory: Negative for dry / hacking cough, hemoptysis, sputum production, shortness of breath, wheezing and stridor.   Cardiovascular: Negative for chest pain, palpitations, orthopnea, claudication, leg swelling and PND.  Gastrointestinal: Negative for heartburn, nausea, vomiting, abdominal pain, diarrhea, constipation, blood in stool and melena.  Genitourinary: Negative for dysuria, urgency, frequency, hematuria and flank pain.  Musculoskeletal: Negative for myalgias, back pain, joint pain and falls.  Skin: Negative for itching and rash.  Neurological: Negative for dizziness, tingling, tremors, sensory change, speech change, focal weakness, seizures, loss of consciousness, weakness and headaches.  Endo/Heme/Allergies: Negative for environmental allergies and polydipsia. Does not bruise/bleed  easily.  SUBJECTIVE:   VITAL SIGNS: Temp:  [97.7 F (36.5 C)-98.7 F (37.1 C)] 98.7 F (37.1 C) (11/29 0747) Pulse Rate:  [33-144] 81 (11/29 0848) Resp:  [9-29] 21 (11/29 0848) BP: (110-175)/(66-117) 130/89 (11/29 0848) SpO2:  [92 %-100 %] 100 % (11/29 0918) FiO2 (%):  [40 %] 40 % (11/29 0859) Weight:  [158 lb 3.2 oz (71.8 kg)] 158 lb 3.2 oz (71.8 kg) (11/29 0445)  PHYSICAL EXAMINATION: General:  Well developed adult female in NAD on bipap PSY: flat affect Neuro:  AAOx4, speech clear, MAE  HEENT:  MM pink/moist, no jvd, pronounced upper airway wheezing, no thrush Cardiovascular:  s1s2 irr irr, AF in 80's on monitor  Lungs:  Even/non-labored on 14/7, 40%, referred wheezing from upper airway, pt with forced exhalation when off bipap, able to sing ABC's and no wheezing noted Abdomen:  Obese/soft, bsx4 active  Musculoskeletal:  No acute deformities  Skin:  Warm/dry, no edema    Recent Labs Lab 05/02/2016 0210 04/28/2016 0425 05/22/16 0051  NA 119* 118* 120*  K 5.6* 5.2* 5.1  CL 85* 85* 84*  CO2 23  --  27  BUN 17 19 14   CREATININE 1.15* 1.00 0.98  GLUCOSE 150* 123*  162*     Recent Labs Lab 05/12/2016 0210 04/27/2016 0425 05/22/16 0051  HGB 9.3* 10.5* 9.9*  HCT 27.4* 31.0* 29.8*  WBC 6.5  --  7.6  PLT 199  --  216    Dg Chest Port 1 View  Result Date: 05/22/2016 CLINICAL DATA:  COPD, shortness of breath, hypertension, CHF, atrial fibrillation EXAM: PORTABLE CHEST 1 VIEW COMPARISON:  Portable exam 0645 hours compared to 05/11/2016 FINDINGS: Enlargement of cardiac silhouette post median sternotomy. Aneurysmal and tortuous thoracic aorta post endoluminal stenting. Pulmonary vascularity normal. Peribronchial thickening with minimal bibasilar atelectasis. No gross infiltrate, pleural effusion or pneumothorax. Bones demineralized. IMPRESSION: Enlargement of cardiac silhouette with evidence of prior aortic endoluminal stenting for aneurysm. Bronchitic changes with minimal  bibasilar atelectasis. Electronically Signed   By: Lavonia Dana M.D.   On: 05/22/2016 07:58   Dg Chest Portable 1 View  Result Date: 05/01/2016 CLINICAL DATA:  Difficulty breathing. EXAM: PORTABLE CHEST 1 VIEW COMPARISON:  Chest radiograph 04/16/2016 FINDINGS: Median sternotomy wires and thoracic aortic endograft stent are unchanged. Cardiac silhouette with upper mediastinal widening is also unchanged. Lung aeration is decreased compared to the prior study, particularly at the left lung base. No pneumothorax or pleural effusion. IMPRESSION: Decreased aeration of both lungs, most notably at the lung bases, likely atelectasis. Electronically Signed   By: Ulyses Jarred M.D.   On: 05/22/2016 02:32      SIGNIFICANT EVENTS  11/28  Admit with SOB  STUDIES:    ASSESSMENT / PLAN:  Dyspnea / Wheezing - suspect multifactorial in the setting of suspected COPD, CHF with possible exacerbation.  Interestingly, I do not see actual documentation of the patient being hypoxic during admit - only increased work of breathing.  There is one note from RRT regarding a call for low SaO2 but no recorded percentage.  Concern there may be some upper airway involvement in wheezing contribution.  DDx includes upper airway syndrome, GERD, CHF, COPD exacerbation & anxiety.   COPD - not defined, former smoker, possible acute exacerbation  Plan: Treat as acute exacerbation of COPD  Continue IV steroids  Doxy BID  Continue nasal hygiene > flonase Claritin Duonebs Q6  Pulmicort BID  Pulmonary hygiene - IS, mobilize  PRN BiPAP to support increased work of breathing Add BID pepcid  Follow CXR intermittently  Assess TSH Assess CT neck   Noe Gens, NP-C [facility] Pgr: [phone] or if no answer [phone] 05/22/2016, 9:36 AM   STAFF NOTE: I, Merrie Roof, MD FACP have personally reviewed patient's available data, including medical history, events of note, physical examination and test  results as part of my evaluation. I have discussed with resident/NP and other care providers such as pharmacist, RN and RRT. In addition, I personally evaluated patient and elicited key findings of: awake, just off bipap, rr mild elevated , able to speak full sentences, thyroid wnl, this does not look bipap responsive, concern obstruction / airway impingement, continued steroids, will send for CT chest now, may need ENT assessment if neg for cord function, move to ICU, concern is need ETT  Lavon Paganini. Titus Mould, MD, [place] Pgr: [facility] 05/22/2016 5:16 PM

## 2016-05-22 NOTE — Progress Notes (Signed)
CT neck/soft tissue showed Significant increase in size of previously treated thoracic aortic aneurysm suggesting further endoleak or contained rupture. The aneurysm no expands cross midline and results in significant mass effect on the distal carina and proximal right and left mainstem bronchi.  CT surgery consulted.   Spoke with patient's son.  He would like for me to speak with Dr. Lunette Stands at [facility] regarding the findings of the CT scan.  Per son, he was told by Dr. Lunette Stands that surgery was not an option at [facility], so another appt was scheduled with Dr. Sammuel Hines, [facility], and would need several procedures.    Spoke with Dr. Lunette Stands, he felt patient would not be a surgical candidate and had recommended hospice in the past. Agreed with comfort care.  Spoke with Dr. Servando Snare, CT surg, agreed that patient is not a surgical candidate at this point.  Recommended "no code."  Spoke with patient's son again, at 5:53pm.  Explained all of these conversations and findings thus far.  He agreed with moving forward with DNR and comfort measures.    Will place patient on morphine and Ativan PRN.   Time spent: 45 minutes.  Bettyanne Dittman D.O. [facility] Pager [phone]  If 7PM-7AM, please contact night-coverage [email or web address] Password [facility] 05/22/2016, 5:56 PM

## 2016-05-22 NOTE — Progress Notes (Signed)
PROGRESS NOTE    Beth [name]  [id number] DOB: November 26, 1935 DOA: 05/08/2016 PCP: Imagene Riches, MD   Chief Complaint  Patient presents with  . Shortness of Breath    Brief Narrative:  HPI on 05/18/2016 by Dr. Ivor Costa Beth [name] is a 80 y.o. female with medical history significant of dCHF, mitral regurgitation, ascending and transverse thoracic aortic aneurysm (s/p of endograft repair in February 2006 at [facility]), descending thoracic aortic aneurysm (s/p of percutaneous repair/endograft 2010 in [facility]),  HTN, GERD, hypothyroidism, breast cancer, dementia, atrial fibrillation not on anticoagulants, CKD-III, former smoker (quit 40 years ago), who presents with SOB. Patient states that she started having shortness of breath yesterday morning, which has been progressively getting worse. She has mild dry cough. She does not have chest pain, fever or chills. She could not speak in full sentences on arrival to ED. No fever or chills. No runny nose or sore throat. Patient denies nausea, vomiting, diarrhea, abdominal pain, symptoms of UTI or unilateral weakness.   Assessment & Plan   Acute respiratory failure with hypoxia secondary to Acute on chronic combined CHF exacerbation -Upon admission, BNP 557 -CXR unremarkable  -Echoacardiogram July 2017 (at [facility]) showed EF 45-50% -cardiology consulted and appreciated -Currently fluid restricted -Cardiology discontinued lasix order due to severe hyponatremia -Echocardiogram pending -Continue nebs steroids -Patient did have "stridorous" breath sounds earlier today, given dose of racemic epi. PCCM consulted and appreciated. Not sure if these sounds are VCD vs other etiology  -Continue BiPAP  Hyponatremia -Sodium today 120 (was 118) -Continue fluid/water restriction -spoke with nephrology, Dr. Jimmy Footman, recommended fluid restriction and lasix (pending formal onsult) -TSH 0.377  Atrial Fibrillation  -CHA2DS2-VASc Score  6 -Not on anticoagulation due to endoleak -Flecainide discontinued -Continue labetalol and aspirin  Hypothyroidism -Continue synthroid -TSH 0.377  GERD -Continue PPI  Essential hypertension -on nitro drip -Hold Cozaar and lasix due to severe hyponatremia  Hyperkalemia  -was K=5.6 without T wave peaking on EKG -Now 5.1 -Continue to monitor BMP  CKD (chronic kidney disease), stage III  -Stable. Baseline creatinine by report 1-1.3, currently Cr 0.98 -Continue to monitor BMP   Normocytic anemia -Baseline hemoglobin 11, currently 9.9 -Anemia panel: Iron 24 -Will start iron supplementation  DVT Prophylaxis  Lovenox  Code Status: Full  Family Communication: None at bedside  Disposition Plan: Admitted  Consultants Cardiology Nephrology Pulmonology   Procedures  None  Antibiotics   Anti-infectives    Start     Dose/Rate Route Frequency Ordered Stop   04/26/2016 1300  doxycycline (VIBRA-TABS) tablet 100 mg     100 mg Oral Every 12 hours 05/23/2016 1245        Subjective:   Forestine Na seen and examined today. Patient continues to feel short of breath. Denies chest pain, abdominal pain, N/V/D/C.   Objective:   Vitals:   05/22/16 0747 05/22/16 0848 05/22/16 0859 05/22/16 0918  BP: 138/78 130/89    Pulse: 95 81    Resp: 17 (!) 21    Temp: 98.7 F (37.1 C)     TempSrc: Axillary     SpO2: 100% 100% 99% 100%  Weight:      Height:        Intake/Output Summary (Last 24 hours) at 05/22/16 1323 Last data filed at 05/22/16 0851  Gross per 24 hour  Intake                0 ml  Output  25 ml  Net              -25 ml   Filed Weights   05/13/2016 0149 05/22/16 0445  Weight: 64.4 kg (142 lb) 71.8 kg (158 lb 3.2 oz)    Exam  General: Well developed, well nourished, respiratory distress  HEENT: NCAT,  mucous membranes moist.   Cardiovascular: S1 S2 auscultated, irregular   Respiratory: Unable to tell if wheezing is referred from [facility]. Mildly  tachypneic   Abdomen: Soft, nontender, nondistended, + bowel sounds  Extremities: warm dry without cyanosis clubbing. Trace LE edema.  Neuro: AAOx3, nonfocal  Psych: Anxious   Data Reviewed: I have personally reviewed following labs and imaging studies  CBC:  Recent Labs Lab 05/13/2016 0210 05/03/2016 0425 05/22/16 0051  WBC 6.5  --  7.6  NEUTROABS 5.6  --   --   HGB 9.3* 10.5* 9.9*  HCT 27.4* 31.0* 29.8*  MCV 87.3  --  88.7  PLT 199  --  [id number]   Basic Metabolic Panel:  Recent Labs Lab 05/13/2016 0210 05/10/2016 0425 05/22/16 0051  NA 119* 118* 120*  K 5.6* 5.2* 5.1  CL 85* 85* 84*  CO2 23  --  27  GLUCOSE 150* 123* 162*  BUN 17 19 14   CREATININE 1.15* 1.00 0.98  CALCIUM 9.0  --  9.0   GFR: Estimated Creatinine Clearance: 45.9 mL/min (by C-G formula based on SCr of 0.98 mg/dL). Liver Function Tests: No results for input(s): AST, ALT, ALKPHOS, BILITOT, PROT, ALBUMIN in the last 168 hours. No results for input(s): LIPASE, AMYLASE in the last 168 hours. No results for input(s): AMMONIA in the last 168 hours. Coagulation Profile: No results for input(s): INR, PROTIME in the last 168 hours. Cardiac Enzymes:  Recent Labs Lab 05/11/2016 0634 05/11/2016 1121 05/07/2016 1835  TROPONINI <0.03 <0.03 <0.03   BNP (last 3 results) No results for input(s): PROBNP in the last 8760 hours. HbA1C: No results for input(s): HGBA1C in the last 72 hours. CBG: No results for input(s): GLUCAP in the last 168 hours. Lipid Profile: No results for input(s): CHOL, HDL, LDLCALC, TRIG, CHOLHDL, LDLDIRECT in the last 72 hours. Thyroid Function Tests:  Recent Labs  05/22/16 0051  TSH 0.377   Anemia Panel:  Recent Labs  05/22/16 0051  VITAMINB12 1,327*  FOLATE 44.7  FERRITIN 185  TIBC 339  IRON 24*  RETICCTPCT 3.5*   Urine analysis:    Component Value Date/Time   COLORURINE YELLOW 12/09/2013 0802   APPEARANCEUR CLEAR 12/09/2013 0802   LABSPEC 1.008 12/09/2013 0802   PHURINE  6.0 12/09/2013 0802   GLUCOSEU NEGATIVE 12/09/2013 0802   HGBUR MODERATE (A) 12/09/2013 0802   BILIRUBINUR NEGATIVE 12/09/2013 0802   KETONESUR NEGATIVE 12/09/2013 0802   PROTEINUR NEGATIVE 12/09/2013 0802   UROBILINOGEN 0.2 12/09/2013 0802   NITRITE POSITIVE (A) 12/09/2013 0802   LEUKOCYTESUR SMALL (A) 12/09/2013 0802   Sepsis Labs: @LABRCNTIP (procalcitonin:4,lacticidven:4)  ) Recent Results (from the past 240 hour(s))  MRSA PCR Screening     Status: None   Collection Time: 05/22/16  1:44 AM  Result Value Ref Range Status   MRSA by PCR NEGATIVE NEGATIVE Final    Comment:        The GeneXpert MRSA Assay (FDA approved for NASAL specimens only), is one component of a comprehensive MRSA colonization surveillance program. It is not intended to diagnose MRSA infection nor to guide or monitor treatment for MRSA infections.       Radiology  Studies: Dg Chest Port 1 View  Result Date: 05/22/2016 CLINICAL DATA:  COPD, shortness of breath, hypertension, CHF, atrial fibrillation EXAM: PORTABLE CHEST 1 VIEW COMPARISON:  Portable exam 0645 hours compared to 05/06/2016 FINDINGS: Enlargement of cardiac silhouette post median sternotomy. Aneurysmal and tortuous thoracic aorta post endoluminal stenting. Pulmonary vascularity normal. Peribronchial thickening with minimal bibasilar atelectasis. No gross infiltrate, pleural effusion or pneumothorax. Bones demineralized. IMPRESSION: Enlargement of cardiac silhouette with evidence of prior aortic endoluminal stenting for aneurysm. Bronchitic changes with minimal bibasilar atelectasis. Electronically Signed   By: Lavonia Dana M.D.   On: 05/22/2016 07:58   Dg Chest Portable 1 View  Result Date: 05/15/2016 CLINICAL DATA:  Difficulty breathing. EXAM: PORTABLE CHEST 1 VIEW COMPARISON:  Chest radiograph 04/16/2016 FINDINGS: Median sternotomy wires and thoracic aortic endograft stent are unchanged. Cardiac silhouette with upper mediastinal widening is  also unchanged. Lung aeration is decreased compared to the prior study, particularly at the left lung base. No pneumothorax or pleural effusion. IMPRESSION: Decreased aeration of both lungs, most notably at the lung bases, likely atelectasis. Electronically Signed   By: Ulyses Jarred M.D.   On: 05/15/2016 02:32     Scheduled Meds: . aspirin EC  81 mg Oral QODAY  . budesonide (PULMICORT) nebulizer solution  0.25 mg Nebulization BID  . doxycycline  100 mg Oral Q12H  . enoxaparin (LOVENOX) injection  40 mg Subcutaneous Daily  . famotidine  20 mg Oral Daily  . ipratropium-albuterol  3 mL Nebulization Q6H  . labetalol  150 mg Oral Q12H  . levothyroxine  75 mcg Oral QAC breakfast  . loratadine  10 mg Oral Daily  . LORazepam  0.5 mg Intravenous Once  . methylPREDNISolone (SOLU-MEDROL) injection  60 mg Intravenous Q12H  . multivitamin with minerals  1 tablet Oral Daily  . pantoprazole  40 mg Oral Daily  . sodium chloride flush  3 mL Intravenous Q12H   Continuous Infusions: . nitroGLYCERIN 25 mcg/min (05/22/16 0314)     LOS: 1 day   Time Spent in minutes   30 minutes  Andreus Cure D.O. on 05/22/2016 at 1:23 PM  Between 7am to 7pm - Pager - [phone]  After 7pm go to [email or web address] - password [email or web address]  And look for the night coverage person covering for me after hours  [facility] Office  [phone]

## 2016-05-22 NOTE — Consult Note (Signed)
Reason for Consult:Hyponatremia Referring Physician: Dr. Imagene Gurney is an 80 y.o. female.  HPI: 48 yre female with hx AA of ascending, and descending Aortic arch, s/p stent graft, hx thyroid dz, Afib, breast Ca, lumphedema, UTIs, CHF with EF 35-40%, HTN, now presented with 2-3 d of progressive SOB.  No cough, CP, N, V, D.  No fevers , chills or urinary sx. Some leg edema but not much. Has PND,  And is now on BIPAP. S Na on admit 119. Now 120.  BPS up at 80-100 dia and 130a -140s sys. .  No neuro sx , not much appetite.  Marland Kitchen k on admit felt secondary to Cozaar. Constitutional: as above Eyes: negative Ears, nose, mouth, throat, and face: negative Respiratory: SOB with out cough Cardiovascular: as above Gastrointestinal: negative Genitourinary:negative Integument/breast: negative Endocrine: thryroid dz Allergic/Immunologic: negative   Past Medical History:  Diagnosis Date  . Acute diastolic heart failure, in combination with MR and atrial fib 12/11/2013  . Acute renal failure (ARF) ([place])   . Aortic aneurysm with dissection ([place])   . Aortic aneurysm, descending ([place])   . Atrial fibrillation ([place])   . Breast CA ([place])   . CHF (congestive heart failure) ([place])   . Hypertension   . Hypomagnesemia, replaced 12/11/2013  . Lymphedema left arm  . Thyroid disease   . UTI (urinary tract infection) 12/11/2013    Past Surgical History:  Procedure Laterality Date  . ABDOMINAL HYSTERECTOMY    . ASCENDING AORTIC ANEURYSM REPAIR    . BREAST LUMPECTOMY    . CARDIOVERSION N/A 12/10/2013   Procedure: CARDIOVERSION   (bedside);  Surgeon: Peter M Martinique, MD;  Location: [facility] OR;  Service: Cardiovascular;  Laterality: N/A;  . DESCENDING AORTIC ANEURYSM REPAIR      Family History  Problem Relation Age of Onset  . Heart disease Mother   . Heart disease Father   . Heart attack Sister   . Heart attack Brother     Social History:  reports that she has quit smoking. She has never used smokeless  tobacco. She reports that she does not drink alcohol or use drugs.  Allergies: No Known Allergies  Medications:  I have reviewed the patient's current medications. Prior to Admission:  Prescriptions Prior to Admission  Medication Sig Dispense Refill Last Dose  . acetaminophen (TYLENOL) 500 MG tablet Take 500 mg by mouth every 6 (six) hours as needed for mild pain.    unk  . aspirin EC 81 MG tablet Take 81 mg by mouth every other day.    Past Week at Unknown time  . bisacodyl (DULCOLAX) 10 MG suppository Place 10 mg rectally as needed for moderate constipation.   unk  . cetirizine (ZYRTEC) 10 MG tablet Take 10 mg by mouth as needed for allergies.    unk  . flecainide (TAMBOCOR) 100 MG tablet Take 50 mg by mouth 2 (two) times daily.    05/20/2016 at Unknown time  . fluticasone (FLONASE) 50 MCG/ACT nasal spray Place 1 spray into both nostrils as needed for allergies.    unk  . furosemide (LASIX) 20 MG tablet Take 1 tablet (20 mg total) by mouth daily. 30 tablet 6 05/20/2016 at Unknown time  . labetalol (NORMODYNE) 300 MG tablet Take 150 mg by mouth every 12 (twelve) hours.   05/20/2016 at 0900  . levothyroxine (SYNTHROID, LEVOTHROID) 75 MCG tablet Take 75 mcg by mouth daily before breakfast.   05/20/2016 at Unknown time  .  losartan (COZAAR) 50 MG tablet Take 50 mg by mouth daily.   05/20/2016 at Unknown time  . magnesium hydroxide (MILK OF MAGNESIA) 400 MG/5ML suspension Take 30 mLs by mouth daily as needed for mild constipation.   unk  . Multiple Vitamin (MULTIVITAMIN WITH MINERALS) TABS tablet Take 1 tablet by mouth daily.   05/20/2016 at Unknown time  . oxyCODONE (OXY IR/ROXICODONE) 5 MG immediate release tablet Take 5 mg by mouth every 6 (six) hours as needed for severe pain.   unk  . pantoprazole (PROTONIX) 40 MG tablet Take 40 mg by mouth daily.   05/20/2016 at Unknown time  . senna-docusate (SENOKOT-S) 8.6-50 MG tablet Take 2 tablets by mouth daily as needed for mild constipation.    unk   . Sodium Phosphates (RA SALINE ENEMA) 19-7 GM/118ML ENEM Place 1 each rectally as needed (for constipation).   unk     Results for orders placed or performed during the hospital encounter of 05/23/2016 (from the past 48 hour(s))  CBC with Differential     Status: Abnormal   Collection Time: 05/01/2016  2:10 AM  Result Value Ref Range   WBC 6.5 4.0 - 10.5 K/uL   RBC 3.14 (L) 3.87 - 5.11 MIL/uL   Hemoglobin 9.3 (L) 12.0 - 15.0 g/dL   HCT 20.7 (L) 21.8 - 28.8 %   MCV 87.3 78.0 - 100.0 fL   MCH 29.6 26.0 - 34.0 pg   MCHC 33.9 30.0 - 36.0 g/dL   RDW 33.7 44.5 - 14.6 %   Platelets 199 150 - 400 K/uL   Neutrophils Relative % 86 %   Neutro Abs 5.6 1.7 - 7.7 K/uL   Lymphocytes Relative 6 %   Lymphs Abs 0.4 (L) 0.7 - 4.0 K/uL   Monocytes Relative 8 %   Monocytes Absolute 0.5 0.1 - 1.0 K/uL   Eosinophils Relative 0 %   Eosinophils Absolute 0.0 0.0 - 0.7 K/uL   Basophils Relative 0 %   Basophils Absolute 0.0 0.0 - 0.1 K/uL  Basic metabolic panel     Status: Abnormal   Collection Time: 04/25/2016  2:10 AM  Result Value Ref Range   Sodium 119 (LL) 135 - 145 mmol/L    Comment: REPEATED TO VERIFY CRITICAL RESULT CALLED TO, READ BACK BY AND VERIFIED WITH: MCCORD S,RN 05/09/2016 0257 WAYK    Potassium 5.6 (H) 3.5 - 5.1 mmol/L   Chloride 85 (L) 101 - 111 mmol/L   CO2 23 22 - 32 mmol/L   Glucose, Bld 150 (H) 65 - 99 mg/dL   BUN 17 6 - 20 mg/dL   Creatinine, Ser 0.47 (H) 0.44 - 1.00 mg/dL   Calcium 9.0 8.9 - 99.8 mg/dL   GFR calc non Af Amer 44 (L) >60 mL/min   GFR calc Af Amer 51 (L) >60 mL/min    Comment: (NOTE) The eGFR has been calculated using the CKD EPI equation. This calculation has not been validated in all clinical situations. eGFR's persistently <60 mL/min signify possible Chronic Kidney Disease.    Anion gap 11 5 - 15  Brain natriuretic peptide     Status: Abnormal   Collection Time: 05/09/2016  2:10 AM  Result Value Ref Range   B Natriuretic Peptide 557.3 (H) 0.0 - 100.0 pg/mL   I-Stat Troponin, ED (not at [facility])     Status: None   Collection Time: 05/10/2016  2:21 AM  Result Value Ref Range   Troponin i, poc 0.00 0.00 - 0.08 ng/mL  Comment 3            Comment: Due to the release kinetics of cTnI, a negative result within the first hours of the onset of symptoms does not rule out myocardial infarction with certainty. If myocardial infarction is still suspected, repeat the test at appropriate intervals.   I-Stat Arterial Blood Gas, ED - (order at [facility] and [facility] only)     Status: Abnormal   Collection Time: 05/23/2016  4:08 AM  Result Value Ref Range   pH, Arterial 7.401 7.350 - 7.450   pCO2 arterial 38.6 32.0 - 48.0 mmHg   pO2, Arterial 241.0 (H) 83.0 - 108.0 mmHg   Bicarbonate 24.1 20.0 - 28.0 mmol/L   TCO2 25 0 - 100 mmol/L   O2 Saturation 100.0 %   Acid-base deficit 1.0 0.0 - 2.0 mmol/L   Patient temperature 97.8 F    Collection site RADIAL, ALLEN'S TEST ACCEPTABLE    Drawn by RT    Sample type ARTERIAL   I-Stat Chem 8, ED     Status: Abnormal   Collection Time: 05/14/2016  4:25 AM  Result Value Ref Range   Sodium 118 (LL) 135 - 145 mmol/L   Potassium 5.2 (H) 3.5 - 5.1 mmol/L   Chloride 85 (L) 101 - 111 mmol/L   BUN 19 6 - 20 mg/dL   Creatinine, Ser 1.00 0.44 - 1.00 mg/dL   Glucose, Bld 123 (H) 65 - 99 mg/dL   Calcium, Ion 1.11 (L) 1.15 - 1.40 mmol/L   TCO2 23 0 - 100 mmol/L   Hemoglobin 10.5 (L) 12.0 - 15.0 g/dL   HCT 31.0 (L) 36.0 - 46.0 %   Comment NOTIFIED PHYSICIAN   Osmolality     Status: Abnormal   Collection Time: 04/30/2016  6:34 AM  Result Value Ref Range   Osmolality 254 (L) 275 - 295 mOsm/kg  Troponin I     Status: None   Collection Time: 05/18/2016  6:34 AM  Result Value Ref Range   Troponin I <0.03 <0.03 ng/mL  Osmolality, urine     Status: None   Collection Time: 05/17/2016  9:01 AM  Result Value Ref Range   Osmolality, Ur 587 300 - 900 mOsm/kg  Sodium, urine, random     Status: None   Collection Time: 05/07/2016  9:01 AM  Result Value  Ref Range   Sodium, Ur 11 mmol/L  Troponin I     Status: None   Collection Time: 04/28/2016 11:21 AM  Result Value Ref Range   Troponin I <0.03 <0.03 ng/mL  Troponin I     Status: None   Collection Time: 04/30/2016  6:35 PM  Result Value Ref Range   Troponin I <0.03 <0.03 ng/mL  Vitamin B12     Status: Abnormal   Collection Time: 05/22/16 12:51 AM  Result Value Ref Range   Vitamin B-12 1,327 (H) 180 - 914 pg/mL    Comment: (NOTE) This assay is not validated for testing neonatal or myeloproliferative syndrome specimens for Vitamin B12 levels.   Folate     Status: None   Collection Time: 05/22/16 12:51 AM  Result Value Ref Range   Folate 44.7 >5.9 ng/mL    Comment: RESULTS CONFIRMED BY MANUAL DILUTION  Iron and TIBC     Status: Abnormal   Collection Time: 05/22/16 12:51 AM  Result Value Ref Range   Iron 24 (L) 28 - 170 ug/dL   TIBC 339 250 - 450 ug/dL   Saturation Ratios  7 (L) 10.4 - 31.8 %   UIBC 315 ug/dL  Ferritin     Status: None   Collection Time: 05/22/16 12:51 AM  Result Value Ref Range   Ferritin 185 11 - 307 ng/mL  Reticulocytes     Status: Abnormal   Collection Time: 05/22/16 12:51 AM  Result Value Ref Range   Retic Ct Pct 3.5 (H) 0.4 - 3.1 %   RBC. 3.36 (L) 3.87 - 5.11 MIL/uL   Retic Count, Manual 117.6 19.0 - 186.0 K/uL  TSH     Status: None   Collection Time: 05/22/16 12:51 AM  Result Value Ref Range   TSH 0.377 0.350 - 4.500 uIU/mL    Comment: Performed by a 3rd Generation assay with a functional sensitivity of <=0.01 uIU/mL.  Basic metabolic panel     Status: Abnormal   Collection Time: 05/22/16 12:51 AM  Result Value Ref Range   Sodium 120 (L) 135 - 145 mmol/L   Potassium 5.1 3.5 - 5.1 mmol/L   Chloride 84 (L) 101 - 111 mmol/L   CO2 27 22 - 32 mmol/L   Glucose, Bld 162 (H) 65 - 99 mg/dL   BUN 14 6 - 20 mg/dL   Creatinine, Ser 0.98 0.44 - 1.00 mg/dL   Calcium 9.0 8.9 - 10.3 mg/dL   GFR calc non Af Amer 54 (L) >60 mL/min   GFR calc Af Amer >60 >60  mL/min    Comment: (NOTE) The eGFR has been calculated using the CKD EPI equation. This calculation has not been validated in all clinical situations. eGFR's persistently <60 mL/min signify possible Chronic Kidney Disease.    Anion gap 9 5 - 15  CBC     Status: Abnormal   Collection Time: 05/22/16 12:51 AM  Result Value Ref Range   WBC 7.6 4.0 - 10.5 K/uL   RBC 3.36 (L) 3.87 - 5.11 MIL/uL   Hemoglobin 9.9 (L) 12.0 - 15.0 g/dL   HCT 29.8 (L) 36.0 - 46.0 %   MCV 88.7 78.0 - 100.0 fL   MCH 29.5 26.0 - 34.0 pg   MCHC 33.2 30.0 - 36.0 g/dL   RDW 15.3 11.5 - 15.5 %   Platelets 216 150 - 400 K/uL  Blood gas, arterial     Status: Abnormal   Collection Time: 05/22/16  1:08 AM  Result Value Ref Range   FIO2 80.00    Delivery systems BILEVEL POSITIVE AIRWAY PRESSURE    Inspiratory PAP 12    Expiratory PAP 8    pH, Arterial 7.347 (L) 7.350 - 7.450   pCO2 arterial 46.2 32.0 - 48.0 mmHg   pO2, Arterial 253 (H) 83.0 - 108.0 mmHg   Bicarbonate 24.6 20.0 - 28.0 mmol/L   Acid-base deficit 0.3 0.0 - 2.0 mmol/L   O2 Saturation 99.7 %   Patient temperature 98.6    Collection site RIGHT RADIAL    Drawn by [id number]    Sample type ARTERIAL    Allens test (pass/fail) PASS PASS  MRSA PCR Screening     Status: None   Collection Time: 05/22/16  1:44 AM  Result Value Ref Range   MRSA by PCR NEGATIVE NEGATIVE    Comment:        The GeneXpert MRSA Assay (FDA approved for NASAL specimens only), is one component of a comprehensive MRSA colonization surveillance program. It is not intended to diagnose MRSA infection nor to guide or monitor treatment for MRSA infections.   TSH  Status: Abnormal   Collection Time: 05/22/16 12:37 PM  Result Value Ref Range   TSH 0.323 (L) 0.350 - 4.500 uIU/mL    Comment: Performed by a 3rd Generation assay with a functional sensitivity of <=0.01 uIU/mL.    Dg Chest Port 1 View  Result Date: 05/22/2016 CLINICAL DATA:  COPD, shortness of breath,  hypertension, CHF, atrial fibrillation EXAM: PORTABLE CHEST 1 VIEW COMPARISON:  Portable exam 0645 hours compared to 05/12/2016 FINDINGS: Enlargement of cardiac silhouette post median sternotomy. Aneurysmal and tortuous thoracic aorta post endoluminal stenting. Pulmonary vascularity normal. Peribronchial thickening with minimal bibasilar atelectasis. No gross infiltrate, pleural effusion or pneumothorax. Bones demineralized. IMPRESSION: Enlargement of cardiac silhouette with evidence of prior aortic endoluminal stenting for aneurysm. Bronchitic changes with minimal bibasilar atelectasis. Electronically Signed   By: Lavonia Dana M.D.   On: 05/22/2016 07:58   Dg Chest Portable 1 View  Result Date: 05/23/2016 CLINICAL DATA:  Difficulty breathing. EXAM: PORTABLE CHEST 1 VIEW COMPARISON:  Chest radiograph 04/16/2016 FINDINGS: Median sternotomy wires and thoracic aortic endograft stent are unchanged. Cardiac silhouette with upper mediastinal widening is also unchanged. Lung aeration is decreased compared to the prior study, particularly at the left lung base. No pneumothorax or pleural effusion. IMPRESSION: Decreased aeration of both lungs, most notably at the lung bases, likely atelectasis. Electronically Signed   By: Ulyses Jarred M.D.   On: 04/25/2016 02:32    ROS Blood pressure (!) 136/97, pulse 97, temperature 98.2 F (36.8 C), temperature source Axillary, resp. rate (!) 30, height '5\' 4"'$  (1.626 m), weight 71.8 kg (158 lb 3.2 oz), SpO2 100 %. Physical Exam Physical Examination: General appearance - pale, on BIPAP Mental status - alert, oriented to person, place, and time Eyes - pupils equal and reactive, extraocular eye movements intact, funduscopic exam normal, discs flat and sharp Neck - adenopathy noted PCL Lymphatics - posterior cervical nodes Chest - wheezes on R, rhonchi and rales in bases Heart - irregularly irregular rhythm with rate 65H, systolic murmur QI6/9 at apex Abdomen - soft, pos bs,  liver down 3 cm Musculoskeletal - no joint tenderness, deformity or swelling Extremities - pedal edema 1 + Skin - pale, no rash  Assessment/Plan: 1 hyponatremia appears by exam and CXR to have mild to mod vol xs.  ie hypervolemic hyponatremia but not a lot of xs vol.  Developed this over time.  Not clear this is all hypervolemia ie could have some component SIADH.  Need to treat as hypervol but have low threshold for Tolvaptan.  On multiple meds implic in stimulating ADH 2 CHF 3 Hypertension: mild , lower vol, lower meds 4. Anemia Very Fe defic and once S Na better, will replete 5. Hx breast Ca,  6 Aortic vasc dz 7 Thyroid dz not a clear contributor. P lasix, fluid restrict, Urine Na/Cr/Osm.  Foley, Need to consider eval for PE  Craven Crean L 05/22/2016, 3:01 PM

## 2016-05-22 NOTE — Progress Notes (Signed)
Called per floor RN at [place] for Pt with acute lethargy and low po2 on bipap. RT called to bedside prior to my arrival and Bipap 02 titrated up to 80%. Po2 on my arrival 100% , RR 16 BP 143/85 HR 90-110s. Pt moves some to sternal rub initially. ABG done per RT. Alanda Slim Triad NP paged. Within about 10 minutes Pt awake alert oriented denies pain. RN to update Provider and monitor Pt closely. RRT will follow.

## 2016-05-22 NOTE — Progress Notes (Signed)
Patient Name: Beth [name] Date of Encounter: 05/22/2016  Primary Cardiologist: Dr. Debbe Odea Problem List     Principal Problem:   Acute respiratory failure with hypoxia [facility]) Active Problems:   Hypothyroid   Acute on chronic diastolic CHF (congestive heart failure) (HCC)   Hypomagnesemia, replaced   Chronic atrial fibrillation (HCC)   GERD (gastroesophageal reflux disease)   HTN (hypertension)   Hyponatremia   Hyperkalemia   CKD (chronic kidney disease), stage III   Acute on chronic respiratory failure with hypoxia (HCC)     Subjective   Breathing is better on Bipap.   Inpatient Medications    Scheduled Meds: . aspirin EC  81 mg Oral QODAY  . budesonide (PULMICORT) nebulizer solution  0.25 mg Nebulization BID  . doxycycline  100 mg Oral Q12H  . enoxaparin (LOVENOX) injection  40 mg Subcutaneous Daily  . furosemide  40 mg Intravenous Once  . ipratropium-albuterol  3 mL Nebulization TID  . labetalol  150 mg Oral Q12H  . levothyroxine  75 mcg Oral QAC breakfast  . loratadine  10 mg Oral Daily  . methylPREDNISolone (SOLU-MEDROL) injection  60 mg Intravenous Q12H  . multivitamin with minerals  1 tablet Oral Daily  . pantoprazole  40 mg Oral Daily  . Racepinephrine HCl  0.5 mL Nebulization Once  . sodium chloride flush  3 mL Intravenous Q12H   Continuous Infusions: . nitroGLYCERIN 25 mcg/min (05/22/16 0314)   PRN Meds: sodium chloride, acetaminophen, albuterol, ALPRAZolam, bisacodyl, fluticasone, hydrALAZINE, magnesium hydroxide, oxyCODONE, senna-docusate, sodium chloride flush, sodium phosphate   Vital Signs    Vitals:   05/22/16 0445 05/22/16 0747 05/22/16 0848 05/22/16 0859  BP: 127/80 138/78 130/89   Pulse: 81 95 81   Resp: 11 17 (!) 21   Temp: 97.7 F (36.5 C) 98.7 F (37.1 C)    TempSrc: Axillary Axillary    SpO2: 100% 100% 100% 99%  Weight: 158 lb 3.2 oz (71.8 kg)     Height:        Intake/Output Summary (Last 24 hours) at 05/22/16  0911 Last data filed at 05/22/16 0851  Gross per 24 hour  Intake                6 ml  Output              125 ml  Net             -119 ml   Filed Weights   04/30/2016 0149 05/22/16 0445  Weight: 142 lb (64.4 kg) 158 lb 3.2 oz (71.8 kg)    Physical Exam   GEN: Well nourished, well developed, in no acute distress.  HEENT: Grossly normal. Bipap in place Neck: Supple, no JVD, carotid bruits, or masses. Cardiac: RRR, no murmurs, rubs, or gallops. No clubbing, cyanosis, edema.  Radials/DP/PT 2+ and equal bilaterally.  Respiratory:  Respirations regular and unlabored, Diffuse inspiratory and expiratory wheezing.  GI: Soft, nontender, nondistended, BS + x 4. MS: no deformity or atrophy. Skin: warm and dry, no rash. Neuro:  Strength and sensation are intact. Psych: AAOx3.  Normal affect.  Labs    CBC  Recent Labs  05/06/2016 0210 04/24/2016 0425 05/22/16 0051  WBC 6.5  --  7.6  NEUTROABS 5.6  --   --   HGB 9.3* 10.5* 9.9*  HCT 27.4* 31.0* 29.8*  MCV 87.3  --  88.7  PLT 199  --  [id number]   Basic Metabolic Panel  Recent Labs  04/26/2016 0210 04/25/2016 0425 05/22/16 0051  NA 119* 118* 120*  K 5.6* 5.2* 5.1  CL 85* 85* 84*  CO2 23  --  27  GLUCOSE 150* 123* 162*  BUN 17 19 14   CREATININE 1.15* 1.00 0.98  CALCIUM 9.0  --  9.0   Cardiac Enzymes  Recent Labs  04/25/2016 0634 05/17/2016 1121 05/19/2016 1835  TROPONINI <0.03 <0.03 <0.03   Thyroid Function Tests  Recent Labs  05/22/16 0051  TSH 0.377    Telemetry    Afib with rates controlled in 80-100. - Personally Reviewed  ECG     Afib- Personally Reviewed  Radiology    Dg Chest Port 1 View  Result Date: 05/22/2016 CLINICAL DATA:  COPD, shortness of breath, hypertension, CHF, atrial fibrillation EXAM: PORTABLE CHEST 1 VIEW COMPARISON:  Portable exam 0645 hours compared to 05/09/2016 FINDINGS: Enlargement of cardiac silhouette post median sternotomy. Aneurysmal and tortuous thoracic aorta post endoluminal stenting.  Pulmonary vascularity normal. Peribronchial thickening with minimal bibasilar atelectasis. No gross infiltrate, pleural effusion or pneumothorax. Bones demineralized. IMPRESSION: Enlargement of cardiac silhouette with evidence of prior aortic endoluminal stenting for aneurysm. Bronchitic changes with minimal bibasilar atelectasis. Electronically Signed   By: Lavonia Dana M.D.   On: 05/22/2016 07:58   Dg Chest Portable 1 View  Result Date: 05/08/2016 CLINICAL DATA:  Difficulty breathing. EXAM: PORTABLE CHEST 1 VIEW COMPARISON:  Chest radiograph 04/16/2016 FINDINGS: Median sternotomy wires and thoracic aortic endograft stent are unchanged. Cardiac silhouette with upper mediastinal widening is also unchanged. Lung aeration is decreased compared to the prior study, particularly at the left lung base. No pneumothorax or pleural effusion. IMPRESSION: Decreased aeration of both lungs, most notably at the lung bases, likely atelectasis. Electronically Signed   By: Ulyses Jarred M.D.   On: 04/24/2016 02:32     Patient Profile     Beth [name] is a 80 y.o. female with a history of ascending, transverse and descending thoracic aortic aneurysms s/p multiple surgeries (see below), chronic mixed S/D CHF, mod MR, chroinc afib (Xarelto d/c'd in 09/2015 due to endoleak), CKD, former smoker and HTN who presented to [facility] on 05/18/2016 with complaints of SOB x 5 days.    Assessment & Plan  1. Chronic combined S/D CHF: BNP ~500. CXR with no overt pulmonary edema. Not significantly volume overloaded on exam. Weight is actually down from a recent office visit in October when she was 159 lbs (now 142). Urine sodium level of 11 suggests she is intravascularly depleted.   Last EF 54-50% by 2D ECHO in 12/2015 at [facility]. Repeat 2D ECHO pending.   IV lasix ordered this am.  We may need to think about treating hyponatremia with Tolvaptan.   2. Chronic atrial fibrillation: she has been continued on Flecainide for unclear  reasons. She has structural heart diease and has permanant atrial fibrillation. Flecainide discontinued as it has no role in chronic Afib. She has had multiple failed cardioversions in the past.   CHA2DS2-VASc Scoreis 6 but not a candidate for oral anticoagulation given her endoleak. Rate currently well controlled   3. Hyponatremia: continue fluid restriction and follow.   4. CKD stage [id number]. Baseline creatinine by report 1-1.3.   5. Hyperkalemia: K=5.1. ECG stable.   6. HTN: On Nitro gtt at 24mcg, BP's well controlled.    Signed, Arbutus Leas, NP  05/22/2016, 9:11 AM  Patient seen and examined and history reviewed. Agree with above findings and plan. Patient's breathing is  better on BIPAP. Less wheezing today. Her Afib rate is well controlled. I do not think this is CHF exacerbation. I think she is intravascularly dry. Will await Echo results but for now will hold off on diuretics given her severe hyponatremia. IV lasix order cancelled. Appreciate pulmonary input.  Arihana Ambrocio Martin[place], [place] 05/22/2016 10:57 AM

## 2016-05-22 NOTE — Progress Notes (Signed)
1256 took Pt off the bipap. BBS clear and no stridor. Pt on RA 99%, RR 21, HR 92

## 2016-05-23 ENCOUNTER — Other Ambulatory Visit (HOSPITAL_COMMUNITY): Payer: Medicare Other

## 2016-05-23 DIAGNOSIS — I5033 Acute on chronic diastolic (congestive) heart failure: Secondary | ICD-10-CM

## 2016-05-23 DIAGNOSIS — J441 Chronic obstructive pulmonary disease with (acute) exacerbation: Secondary | ICD-10-CM

## 2016-05-23 LAB — CBC
HCT: 25.5 % — ABNORMAL LOW (ref 36.0–46.0)
Hemoglobin: 8.6 g/dL — ABNORMAL LOW (ref 12.0–15.0)
MCH: 29.2 pg (ref 26.0–34.0)
MCHC: 33.7 g/dL (ref 30.0–36.0)
MCV: 86.4 fL (ref 78.0–100.0)
Platelets: 223 10*3/uL (ref 150–400)
RBC: 2.95 MIL/uL — ABNORMAL LOW (ref 3.87–5.11)
RDW: 15.3 % (ref 11.5–15.5)
WBC: 7.4 10*3/uL (ref 4.0–10.5)

## 2016-05-23 LAB — BASIC METABOLIC PANEL
Anion gap: 9 (ref 5–15)
BUN: 20 mg/dL (ref 6–20)
CALCIUM: 8.5 mg/dL — AB (ref 8.9–10.3)
CO2: 25 mmol/L (ref 22–32)
CREATININE: 0.91 mg/dL (ref 0.44–1.00)
Chloride: 78 mmol/L — ABNORMAL LOW (ref 101–111)
GFR calc Af Amer: >60 mL/min (ref >60)
GFR, EST NON AFRICAN AMERICAN: 59 mL/min — AB (ref >60)
GLUCOSE: 141 mg/dL — AB (ref 65–99)
Potassium: 4.4 mmol/L (ref 3.5–5.1)
Sodium: 112 mmol/L — CL (ref 135–145)

## 2016-05-23 LAB — KAPPA/LAMBDA LIGHT CHAINS
KAPPA FREE LGHT CHN: 13.1 mg/L (ref 3.3–19.4)
KAPPA, LAMDA LIGHT CHAIN RATIO: 1.07 (ref 0.26–1.65)
LAMDA FREE LIGHT CHAINS: 12.2 mg/L (ref 5.7–26.3)

## 2016-05-23 LAB — PROTEIN ELECTROPHORESIS, SERUM
A/G RATIO SPE: 1.3 (ref 0.7–1.7)
ALPHA-2-GLOBULIN: 0.5 g/dL (ref 0.4–1.0)
Albumin ELP: 3.4 g/dL (ref 2.9–4.4)
Alpha-1-Globulin: 0.4 g/dL (ref 0.0–0.4)
Beta Globulin: 0.9 g/dL (ref 0.7–1.3)
GLOBULIN, TOTAL: 2.7 g/dL (ref 2.2–3.9)
Gamma Globulin: 1 g/dL (ref 0.4–1.8)
Total Protein ELP: 6.1 g/dL (ref 6.0–8.5)

## 2016-05-23 MED ORDER — SODIUM CHLORIDE 0.9 % IV SOLN
1.0000 mg/h | INTRAVENOUS | Status: DC
  Administered 2016-05-23: 2 mg/h via INTRAVENOUS
  Filled 2016-05-23: qty 10

## 2016-05-23 NOTE — Progress Notes (Signed)
At 0200, Patient was in distress with her breathing. Oxygen via nasal canula did not seem to help. Pt stated "I cannot breath". Coughing frequently. Working hard to catch her breath. Multiple interventions were done - Morphine IV, scheduled and PRN breathing treatments, and oxygen. Respiratory Therapist came up to assess, and agreed pt needed BIPAP back on. Bipap was discontinued. Has been DNR since yesterday. Paged on call NP Schorr about patient's distress. Placed BIPAP  Order as needed per recommendation of PCCM.  Currently patient is in no distress. RR 18, O2 sats 100% and is finally resting. Will continue to monitor.

## 2016-05-23 NOTE — Progress Notes (Signed)
Pt continues to be unable to tolerate removal of bipap, c/o not being able to breathe, morphine given , with relief noted, Dr Ree Kida notified, will continue to monitor.  Edward Qualia RN

## 2016-05-23 NOTE — Progress Notes (Signed)
Patient Name: Beth [name] Date of Encounter: 05/23/2016  Primary Cardiologist: Dr. Debbe Odea Problem List     Principal Problem:   Acute respiratory failure with hypoxia [facility]) Active Problems:   Hypothyroid   Acute on chronic diastolic CHF (congestive heart failure) (HCC)   Hypomagnesemia, replaced   Chronic atrial fibrillation (HCC)   GERD (gastroesophageal reflux disease)   HTN (hypertension)   Hyponatremia   Hyperkalemia   CKD (chronic kidney disease), stage III   Acute on chronic respiratory failure with hypoxia (HCC)   COPD exacerbation (HCC)   Stridor     Subjective   Resting comfortably, in no distress.  Inpatient Medications    Scheduled Meds: . aspirin EC  81 mg Oral QODAY  . budesonide (PULMICORT) nebulizer solution  0.25 mg Nebulization BID  . doxycycline  100 mg Oral Q12H  . enoxaparin (LOVENOX) injection  40 mg Subcutaneous Daily  . famotidine  20 mg Oral Daily  . furosemide  80 mg Intravenous Q12H  . ipratropium-albuterol  3 mL Nebulization Q6H  . levothyroxine  75 mcg Oral QAC breakfast  . loratadine  10 mg Oral Daily  . methylPREDNISolone (SOLU-MEDROL) injection  60 mg Intravenous Q12H  . multivitamin with minerals  1 tablet Oral Daily  . pantoprazole  40 mg Oral Daily  . sodium chloride flush  3 mL Intravenous Q12H   Continuous Infusions: . nitroGLYCERIN 15 mcg/min (05/23/16 0549)   PRN Meds: sodium chloride, acetaminophen, albuterol, bisacodyl, fluticasone, hydrALAZINE, LORazepam, magnesium hydroxide, morphine injection, oxyCODONE, senna-docusate, sodium chloride flush, sodium phosphate   Vital Signs    Vitals:   05/23/16 0525 05/23/16 0545 05/23/16 0615 05/23/16 0804  BP: 106/65 101/66 122/86 106/79  Pulse: 98 95 (!) 105 76  Resp: 10 12 10 10   Temp:    98.5 F (36.9 C)  TempSrc:    Oral  SpO2: 100% 100% 99% 100%  Weight:      Height:        Intake/Output Summary (Last 24 hours) at 05/23/16 0842 Last data filed at  05/23/16 0443  Gross per 24 hour  Intake              120 ml  Output             2225 ml  Net            -2105 ml   Filed Weights   05/08/2016 0149 05/22/16 0445 05/23/16 0438  Weight: 142 lb (64.4 kg) 158 lb 3.2 oz (71.8 kg) 158 lb (71.7 kg)    Physical Exam    GEN: Ill appearing female in no acute distress.  HEENT: Grossly normal. Bipap in place.  Neck: Supple, no JVD, carotid bruits, or masses. Cardiac: RRR, no murmurs, rubs, or gallops. No clubbing, cyanosis, edema.  Radials/DP/PT 2+ and equal bilaterally.  Respiratory:  Respirations regular and unlabored, clear to auscultation bilaterally. GI: Soft, nontender, nondistended, BS + x 4. MS: no deformity or atrophy. Skin: warm and dry, no rash. Neuro:  Strength and sensation are intact. Psych: AAOx3.  Normal affect.  Labs    CBC  Recent Labs  04/25/2016 0210  05/22/16 0051 05/23/16 0311  WBC 6.5  --  7.6 7.4  NEUTROABS 5.6  --   --   --   HGB 9.3*  < > 9.9* 8.6*  HCT 27.4*  < > 29.8* 25.5*  MCV 87.3  --  88.7 86.4  PLT 199  --  216 223  < > =  values in this interval not displayed. Basic Metabolic Panel  Recent Labs  05/22/16 0051 05/23/16 0311  NA 120* 112*  K 5.1 4.4  CL 84* 78*  CO2 27 25  GLUCOSE 162* 141*  BUN 14 20  CREATININE 0.98 0.91  CALCIUM 9.0 8.5*   Cardiac Enzymes  Recent Labs  05/05/2016 0634 05/09/2016 1121 05/20/2016 1835  TROPONINI <0.03 <0.03 <0.03   Thyroid Function Tests  Recent Labs  05/22/16 1237  TSH 0.323*    Telemetry    Afib - Personally Reviewed    Radiology    Ct Soft Tissue Neck Wo Contrast  Result Date: 05/22/2016 CLINICAL DATA:  Stridor. Wheezing and stridor when night using BiPAP. EXAM: CT NECK WITHOUT CONTRAST TECHNIQUE: Multidetector CT imaging of the neck was performed following the standard protocol without intravenous contrast. COMPARISON:  CT of the face 06/29/2015 FINDINGS: Pharynx and larynx: No focal mucosal or submucosal lesions are present. The airway  is patent. Vocal cords are midline and symmetric. No focal mucosal or submucosal lesions are present. The parapharyngeal fat is clear. Salivary glands: Submandibular and parotid glands are within normal limits bilaterally. Thyroid: Within normal limits. Lymph nodes: No significant adenopathy is present. Vascular: There is significant interval enlargement of the previously noted aortic aneurysm. The aneurysm now measures 6.4 cm. There is expansion of the aneurysm sac across the midline which results in mass effect on the distal trachea, carina, and right and left mainstem bronchi. High-density material within the expanded aneurysm may represent blood products. Limited intracranial: Within normal limits. Visualized orbits: Unremarkable. Mastoids and visualized paranasal sinuses: Clear Skeleton: Degenerative changes of the cervical spine are most pronounced at C5-6, stable compared to prior exams. Upper chest: The lung apices are clear. IMPRESSION: 1. Significant increase in size of previously treated thoracic aortic aneurysm suggesting further endoleak or contained rupture. The aneurysm no expands cross midline and results in significant mass effect on the distal carina and proximal right and left mainstem bronchi. The distal aspect of the aneurysm is not imaged. 2. The airway in the neck is unremarkable. These results were called by telephone at the time of interpretation on 05/22/2016 at 4:53 pm to Dr. Ree Kida, who verbally acknowledged these results. Electronically Signed   By: San Morelle M.D.   On: 05/22/2016 16:53   Dg Chest Port 1 View  Result Date: 05/22/2016 CLINICAL DATA:  COPD, shortness of breath, hypertension, CHF, atrial fibrillation EXAM: PORTABLE CHEST 1 VIEW COMPARISON:  Portable exam 0645 hours compared to 05/20/2016 FINDINGS: Enlargement of cardiac silhouette post median sternotomy. Aneurysmal and tortuous thoracic aorta post endoluminal stenting. Pulmonary vascularity normal.  Peribronchial thickening with minimal bibasilar atelectasis. No gross infiltrate, pleural effusion or pneumothorax. Bones demineralized. IMPRESSION: Enlargement of cardiac silhouette with evidence of prior aortic endoluminal stenting for aneurysm. Bronchitic changes with minimal bibasilar atelectasis. Electronically Signed   By: Lavonia Dana M.D.   On: 05/22/2016 07:58    Patient Profile  Beth [name] a 80 y.o. femalewith a history of ascending,transverse and descendingthoracic aortic aneurysms s/p multiple surgeries (see below), chronic mixed S/D CHF, mod MR, chroinc afib (Xarelto d/c'd in 09/2015 due to endoleak), CKD, former smoker and HTN who presented to [facility] on 04/29/2016 with complaints of SOB x 5 days     Assessment & Plan    1. Chronic combined S/D CHF:  CXR with no overt pulmonary edema. Not significantly volume overloaded on exam.  Urine sodium level of 11 suggests she is intravascularly depleted.  Last EF 45-50% by 2D ECHO in 12/2015 at [facility]. Echo pending here  2. Chronic atrial fibrillation: she has been continued on Flecainide for unclear reasons. She has structural heart diease and has permanant atrial fibrillation. Flecainide discontinued as it has no role in chronic Afib. She has had multiple failed cardioversions in the past.   CHA2DS2-VASc Scoreis 6 but not a candidate for oral anticoagulation given her endoleak. Rate currently well controlled   3. Hyponatremia: continue fluid restriction and follow.   4. CKD stage [id number]. Baseline creatinine by report 1-1.3.   5. Hyperkalemia: K=5.1. ECG stable.   6. HTN: On Nitro gtt at 52mcg, BP's well controlled.   7. Type 1 Endo leak: TCTS has seen, recommendation is comfort care.   Signed, Arbutus Leas, NP  05/23/2016, 8:42 AM  Patient seen and examined and history reviewed. Agree with above findings and plan. Patient in moderate respiratory distress on Bipap. Diffuse coarse wheezing. No edema. Good response to  diuretics with negative I/O 2 liters but no impact on breathing. I am still not convinced that she is volume overloaded. Sodium level dropping with Lasix. Consider adding tolvaptan. Will defer to nephrology. Lack of response to aggressive pulmonary treatment. Overall prognosis looks quite poor.   Manu Rubey [place], [place] 05/23/2016 10:14 AM

## 2016-05-23 NOTE — Progress Notes (Addendum)
PROGRESS NOTE    [name]  [id number] DOB: Feb 02, 1936 DOA: 05/19/2016 PCP: Imagene Riches, MD   Chief Complaint  Patient presents with  . Shortness of Breath    Brief Narrative:  HPI on 04/26/2016 by Dr. Ivor Costa Beth [name] is a 80 y.o. female with medical history significant of dCHF, mitral regurgitation, ascending and transverse thoracic aortic aneurysm (s/p of endograft repair in February 2006 at [facility]), descending thoracic aortic aneurysm (s/p of percutaneous repair/endograft 2010 in [facility]),  HTN, GERD, hypothyroidism, breast cancer, dementia, atrial fibrillation not on anticoagulants, CKD-III, former smoker (quit 40 years ago), who presents with SOB. Patient states that she started having shortness of breath yesterday morning, which has been progressively getting worse. She has mild dry cough. She does not have chest pain, fever or chills. She could not speak in full sentences on arrival to ED. No fever or chills. No runny nose or sore throat. Patient denies nausea, vomiting, diarrhea, abdominal pain, symptoms of UTI or unilateral weakness.   Assessment & Plan   Acute respiratory failure with hypoxia secondary to tracheal compression vs Acute on chronic combined CHF exacerbation vs COPD exac -Upon admission, BNP 557 -CXR unremarkable  -Echoacardiogram July 2017 (at [facility]) showed EF 45-50% -cardiology consulted and appreciated -Currently fluid restriction -Patient did have "stridorous" breath sounds earlier today, given dose of racemic epi. PCCM consulted and appreciated. Not sure if these sounds are VCD vs other etiology  -Continue BiPAP -patient given IV lasix yesterday, UOP 2225cc over the past 24hrs -CT neck: Increase in size of previously treated thoracic aortic aneurysm causing mass effect on the distal carina and proximal right and left mainstem bronchi -CT surgery consulted.  I also spoke with Dr. Lunette Stands (CT surg at [facility]) on 11/29.  Patient is a  poor surgical candidate. This was also confirmed by Dr. Servando Snare.  -Spoke with patient's son via phone, decision for DNR and comfort made on 11/29.  -Patient currently dependent on BiPAP. Will continue conversation regarding full comfort measures with patient's son today when he arrives.  -Continue morphine and ativan PRN for comfort.  -Will discontinue steroids and doxycycline  Hyponatremia -Sodium today 112 -Failed fluid restriction and high dose lasix -spoke with nephrology, Dr. Jimmy Footman, recommended fluid restriction and lasix (pending formal onsult) -TSH 0.377 -Given comfort care and poor prognosis, will hold off of tolvaptan.   Atrial Fibrillation  -CHA2DS2-VASc Score 6 -Not on anticoagulation due to endoleak -Flecainide discontinued -Continue labetalol and aspirin  Hypothyroidism -Continue synthroid -TSH 0.377  GERD -Continue PPI  Essential hypertension -on nitro drip -Hold Cozaar and lasix due to severe hyponatremia -BP currently stable  Hyperkalemia  -was K=5.6 without T wave peaking on EKG -Currently 4.4 -Continue to monitor BMP  CKD (chronic kidney disease), stage III  -Stable. Baseline creatinine by report 1-1.3, currently Cr 0.91 -Continue to monitor BMP   Normocytic anemia -Baseline hemoglobin 11, currently 8.6 -Anemia panel: Iron 24  Dementia  Goals of care -Code status discussed with son via phone. Currently DNR -Patient does have a poor prognosis  -Currently on morphine and ativan PRN for comfort.  Will speak to patient's son regarding morphine drip and discontinuing BiPAP  DVT Prophylaxis  Lovenox (will discontinue)  Code Status: DNR  Family Communication: None at bedside. Son via phone  Disposition Plan: Admitted  Consultants Cardiology Nephrology Pulmonology   Procedures  None  Antibiotics   Anti-infectives    Start     Dose/Rate Route Frequency Ordered Stop  05/01/2016 1300  doxycycline (VIBRA-TABS) tablet 100 mg     100  mg Oral Every 12 hours 04/28/2016 1245        Subjective:   Forestine Na seen and examined today. Patient continues to feel short of breath and needs BiPAP. Denies chest pain, abdominal pain, N/V/D/C. Continuously asks if Dr. Lunette Stands has been called.   Objective:   Vitals:   05/23/16 0804 05/23/16 0949 05/23/16 0953 05/23/16 1221  BP: 106/79     Pulse: 76 (!) 105  (!) 103  Resp: 10 (!) 24  (!) 21  Temp: 98.5 F (36.9 C)     TempSrc: Oral     SpO2: 100% 100% 100% 100%  Weight:      Height:        Intake/Output Summary (Last 24 hours) at 05/23/16 1341 Last data filed at 05/23/16 1315  Gross per 24 hour  Intake              120 ml  Output             3800 ml  Net            -3680 ml   Filed Weights   05/06/2016 0149 05/22/16 0445 05/23/16 0438  Weight: 64.4 kg (142 lb) 71.8 kg (158 lb 3.2 oz) 71.7 kg (158 lb)    Exam  General: Well developed, well nourished, respiratory distress  HEENT: NCAT, BiPAP in place  Cardiovascular: S1 S2 auscultated, irregular   Respiratory: Diminished  Abdomen: Soft, nontender, nondistended, + bowel sounds  Extremities: warm dry without cyanosis clubbing. Trace LE edema.  Neuro: AAOx3, nonfocal  Psych: Anxious   Data Reviewed: I have personally reviewed following labs and imaging studies  CBC:  Recent Labs Lab 05/16/2016 0210 05/19/2016 0425 05/22/16 0051 05/23/16 0311  WBC 6.5  --  7.6 7.4  NEUTROABS 5.6  --   --   --   HGB 9.3* 10.5* 9.9* 8.6*  HCT 27.4* 31.0* 29.8* 25.5*  MCV 87.3  --  88.7 86.4  PLT 199  --  216 [id number]   Basic Metabolic Panel:  Recent Labs Lab 04/26/2016 0210 05/12/2016 0425 05/22/16 0051 05/23/16 0311  NA 119* 118* 120* 112*  K 5.6* 5.2* 5.1 4.4  CL 85* 85* 84* 78*  CO2 23  --  27 25  GLUCOSE 150* 123* 162* 141*  BUN 17 19 14 20   CREATININE 1.15* 1.00 0.98 0.91  CALCIUM 9.0  --  9.0 8.5*   GFR: Estimated Creatinine Clearance: 49.5 mL/min (by C-G formula based on SCr of 0.91 mg/dL). Liver Function  Tests: No results for input(s): AST, ALT, ALKPHOS, BILITOT, PROT, ALBUMIN in the last 168 hours. No results for input(s): LIPASE, AMYLASE in the last 168 hours. No results for input(s): AMMONIA in the last 168 hours. Coagulation Profile: No results for input(s): INR, PROTIME in the last 168 hours. Cardiac Enzymes:  Recent Labs Lab 05/15/2016 0634 05/11/2016 1121 04/27/2016 1835  TROPONINI <0.03 <0.03 <0.03   BNP (last 3 results) No results for input(s): PROBNP in the last 8760 hours. HbA1C: No results for input(s): HGBA1C in the last 72 hours. CBG: No results for input(s): GLUCAP in the last 168 hours. Lipid Profile: No results for input(s): CHOL, HDL, LDLCALC, TRIG, CHOLHDL, LDLDIRECT in the last 72 hours. Thyroid Function Tests:  Recent Labs  05/22/16 1237  TSH 0.323*   Anemia Panel:  Recent Labs  05/22/16 0051  VITAMINB12 1,327*  FOLATE 44.7  FERRITIN 185  TIBC 339  IRON 24*  RETICCTPCT 3.5*   Urine analysis:    Component Value Date/Time   COLORURINE AMBER (A) 05/22/2016 1521   APPEARANCEUR CLEAR 05/22/2016 1521   LABSPEC 1.019 05/22/2016 1521   PHURINE 5.0 05/22/2016 1521   GLUCOSEU NEGATIVE 05/22/2016 1521   HGBUR NEGATIVE 05/22/2016 1521   BILIRUBINUR NEGATIVE 05/22/2016 1521   KETONESUR NEGATIVE 05/22/2016 1521   PROTEINUR 100 (A) 05/22/2016 1521   UROBILINOGEN 0.2 12/09/2013 0802   NITRITE NEGATIVE 05/22/2016 1521   LEUKOCYTESUR NEGATIVE 05/22/2016 1521   Sepsis Labs: @LABRCNTIP (procalcitonin:4,lacticidven:4)  ) Recent Results (from the past 240 hour(s))  MRSA PCR Screening     Status: None   Collection Time: 05/22/16  1:44 AM  Result Value Ref Range Status   MRSA by PCR NEGATIVE NEGATIVE Final    Comment:        The GeneXpert MRSA Assay (FDA approved for NASAL specimens only), is one component of a comprehensive MRSA colonization surveillance program. It is not intended to diagnose MRSA infection nor to guide or monitor treatment for MRSA  infections.       Radiology Studies: Ct Soft Tissue Neck Wo Contrast  Result Date: 05/22/2016 CLINICAL DATA:  Stridor. Wheezing and stridor when night using BiPAP. EXAM: CT NECK WITHOUT CONTRAST TECHNIQUE: Multidetector CT imaging of the neck was performed following the standard protocol without intravenous contrast. COMPARISON:  CT of the face 06/29/2015 FINDINGS: Pharynx and larynx: No focal mucosal or submucosal lesions are present. The airway is patent. Vocal cords are midline and symmetric. No focal mucosal or submucosal lesions are present. The parapharyngeal fat is clear. Salivary glands: Submandibular and parotid glands are within normal limits bilaterally. Thyroid: Within normal limits. Lymph nodes: No significant adenopathy is present. Vascular: There is significant interval enlargement of the previously noted aortic aneurysm. The aneurysm now measures 6.4 cm. There is expansion of the aneurysm sac across the midline which results in mass effect on the distal trachea, carina, and right and left mainstem bronchi. High-density material within the expanded aneurysm may represent blood products. Limited intracranial: Within normal limits. Visualized orbits: Unremarkable. Mastoids and visualized paranasal sinuses: Clear Skeleton: Degenerative changes of the cervical spine are most pronounced at C5-6, stable compared to prior exams. Upper chest: The lung apices are clear. IMPRESSION: 1. Significant increase in size of previously treated thoracic aortic aneurysm suggesting further endoleak or contained rupture. The aneurysm no expands cross midline and results in significant mass effect on the distal carina and proximal right and left mainstem bronchi. The distal aspect of the aneurysm is not imaged. 2. The airway in the neck is unremarkable. These results were called by telephone at the time of interpretation on 05/22/2016 at 4:53 pm to Dr. Ree Kida, who verbally acknowledged these results.  Electronically Signed   By: San Morelle M.D.   On: 05/22/2016 16:53   Dg Chest Port 1 View  Result Date: 05/22/2016 CLINICAL DATA:  COPD, shortness of breath, hypertension, CHF, atrial fibrillation EXAM: PORTABLE CHEST 1 VIEW COMPARISON:  Portable exam 0645 hours compared to 04/26/2016 FINDINGS: Enlargement of cardiac silhouette post median sternotomy. Aneurysmal and tortuous thoracic aorta post endoluminal stenting. Pulmonary vascularity normal. Peribronchial thickening with minimal bibasilar atelectasis. No gross infiltrate, pleural effusion or pneumothorax. Bones demineralized. IMPRESSION: Enlargement of cardiac silhouette with evidence of prior aortic endoluminal stenting for aneurysm. Bronchitic changes with minimal bibasilar atelectasis. Electronically Signed   By: Lavonia Dana M.D.   On: 05/22/2016 07:58     Scheduled Meds: .  aspirin EC  81 mg Oral QODAY  . budesonide (PULMICORT) nebulizer solution  0.25 mg Nebulization BID  . doxycycline  100 mg Oral Q12H  . enoxaparin (LOVENOX) injection  40 mg Subcutaneous Daily  . famotidine  20 mg Oral Daily  . furosemide  80 mg Intravenous Q12H  . ipratropium-albuterol  3 mL Nebulization Q6H  . levothyroxine  75 mcg Oral QAC breakfast  . loratadine  10 mg Oral Daily  . methylPREDNISolone (SOLU-MEDROL) injection  60 mg Intravenous Q12H  . multivitamin with minerals  1 tablet Oral Daily  . pantoprazole  40 mg Oral Daily  . sodium chloride flush  3 mL Intravenous Q12H   Continuous Infusions: . nitroGLYCERIN 15 mcg/min (05/23/16 0549)     LOS: 2 days   Time Spent in minutes   30 minutes  Dorota Heinrichs D.O. on 05/23/2016 at 1:41 PM  Between 7am to 7pm - Pager - [phone]  After 7pm go to [email or web address] - password [email or web address]  And look for the night coverage person covering for me after hours  [facility] Office  [phone]

## 2016-05-23 NOTE — Progress Notes (Signed)
CRITICAL VALUE ALERT  Critical value received:  Sodium 112  Date of notification:  05/23/16  Time of notification:  0536  Critical value read back: Yes  Nurse who received alert:  Dalani Mette  MD notified (1st page):  NP Schorr  Time of first page: [phone]  MD notified (2nd page):  Time of second page:  Responding MD:   Time MD responded:

## 2016-05-23 NOTE — Progress Notes (Signed)
Came to do patients breathing treatment, pt was sitting up on the side of the bed stating she could not breath. Patient was placed on 6 LPM by RN. Patient was labored and had increased WOB. Pt was placed on BIPAP. RT will continue to monitor. SPO2 99%.

## 2016-05-23 NOTE — Progress Notes (Signed)
Subjective:  On bipap- had great  UOP but sodium went from 120 to 112 ! She denies nausea or pain.  Pt now requiring bipap 24/7 with no known etiology and is regressing- primary team has made her comfort care Objective Vital signs in last 24 hours: Vitals:   05/23/16 0615 05/23/16 0804 05/23/16 0949 05/23/16 0953  BP: 122/86 106/79    Pulse: (!) 105 76 (!) 105   Resp: 10 10 (!) 24   Temp:  98.5 F (36.9 C)    TempSrc:  Oral    SpO2: 99% 100% 100% 100%  Weight:      Height:       Weight change: -0.091 kg (-3.2 oz)  Intake/Output Summary (Last 24 hours) at 05/23/16 1126 Last data filed at 05/23/16 0900  Gross per 24 hour  Intake              120 ml  Output             3800 ml  Net            -3680 ml    Assessment/ Plan: Pt is a 80 y.o. yo female who was admitted on 05/23/2016 with respiratory distress and hyponatremia  Assessment/Plan: 1. Hyponatremia- worsening hypervolemia and SIADH- did diurese.  I was going to give tolvaptan however nurse tells me that she is really unable to do without bipap for 10 seconds before dcompensating so could not swallow pill.  Even if we could fis her sodium I dont think that would lead to overall  Improvement so will defer management to primary team, likely to stop checking labs and renal will sign off  Cheyenne Bordeaux A    Labs: Basic Metabolic Panel:  Recent Labs Lab 05/15/2016 0210 05/05/2016 0425 05/22/16 0051 05/23/16 0311  NA 119* 118* 120* 112*  K 5.6* 5.2* 5.1 4.4  CL 85* 85* 84* 78*  CO2 23  --  27 25  GLUCOSE 150* 123* 162* 141*  BUN 17 19 14 20   CREATININE 1.15* 1.00 0.98 0.91  CALCIUM 9.0  --  9.0 8.5*   Liver Function Tests: No results for input(s): AST, ALT, ALKPHOS, BILITOT, PROT, ALBUMIN in the last 168 hours. No results for input(s): LIPASE, AMYLASE in the last 168 hours. No results for input(s): AMMONIA in the last 168 hours. CBC:  Recent Labs Lab 04/29/2016 0210 05/12/2016 0425 05/22/16 0051 05/23/16 0311   WBC 6.5  --  7.6 7.4  NEUTROABS 5.6  --   --   --   HGB 9.3* 10.5* 9.9* 8.6*  HCT 27.4* 31.0* 29.8* 25.5*  MCV 87.3  --  88.7 86.4  PLT 199  --  216 223   Cardiac Enzymes:  Recent Labs Lab 05/11/2016 0634 04/30/2016 1121 05/18/2016 1835  TROPONINI <0.03 <0.03 <0.03   CBG: No results for input(s): GLUCAP in the last 168 hours.  Iron Studies:  Recent Labs  05/22/16 0051  IRON 24*  TIBC 339  FERRITIN 185   Studies/Results: Ct Soft Tissue Neck Wo Contrast  Result Date: 05/22/2016 CLINICAL DATA:  Stridor. Wheezing and stridor when night using BiPAP. EXAM: CT NECK WITHOUT CONTRAST TECHNIQUE: Multidetector CT imaging of the neck was performed following the standard protocol without intravenous contrast. COMPARISON:  CT of the face 06/29/2015 FINDINGS: Pharynx and larynx: No focal mucosal or submucosal lesions are present. The airway is patent. Vocal cords are midline and symmetric. No focal mucosal or submucosal lesions are present. The parapharyngeal fat is clear.  Salivary glands: Submandibular and parotid glands are within normal limits bilaterally. Thyroid: Within normal limits. Lymph nodes: No significant adenopathy is present. Vascular: There is significant interval enlargement of the previously noted aortic aneurysm. The aneurysm now measures 6.4 cm. There is expansion of the aneurysm sac across the midline which results in mass effect on the distal trachea, carina, and right and left mainstem bronchi. High-density material within the expanded aneurysm may represent blood products. Limited intracranial: Within normal limits. Visualized orbits: Unremarkable. Mastoids and visualized paranasal sinuses: Clear Skeleton: Degenerative changes of the cervical spine are most pronounced at C5-6, stable compared to prior exams. Upper chest: The lung apices are clear. IMPRESSION: 1. Significant increase in size of previously treated thoracic aortic aneurysm suggesting further endoleak or contained  rupture. The aneurysm no expands cross midline and results in significant mass effect on the distal carina and proximal right and left mainstem bronchi. The distal aspect of the aneurysm is not imaged. 2. The airway in the neck is unremarkable. These results were called by telephone at the time of interpretation on 05/22/2016 at 4:53 pm to Dr. Ree Kida, who verbally acknowledged these results. Electronically Signed   By: San Morelle M.D.   On: 05/22/2016 16:53   Dg Chest Port 1 View  Result Date: 05/22/2016 CLINICAL DATA:  COPD, shortness of breath, hypertension, CHF, atrial fibrillation EXAM: PORTABLE CHEST 1 VIEW COMPARISON:  Portable exam 0645 hours compared to 04/24/2016 FINDINGS: Enlargement of cardiac silhouette post median sternotomy. Aneurysmal and tortuous thoracic aorta post endoluminal stenting. Pulmonary vascularity normal. Peribronchial thickening with minimal bibasilar atelectasis. No gross infiltrate, pleural effusion or pneumothorax. Bones demineralized. IMPRESSION: Enlargement of cardiac silhouette with evidence of prior aortic endoluminal stenting for aneurysm. Bronchitic changes with minimal bibasilar atelectasis. Electronically Signed   By: Lavonia Dana M.D.   On: 05/22/2016 07:58   Medications: Infusions: . nitroGLYCERIN 15 mcg/min (05/23/16 0549)    Scheduled Medications: . aspirin EC  81 mg Oral QODAY  . budesonide (PULMICORT) nebulizer solution  0.25 mg Nebulization BID  . doxycycline  100 mg Oral Q12H  . enoxaparin (LOVENOX) injection  40 mg Subcutaneous Daily  . famotidine  20 mg Oral Daily  . furosemide  80 mg Intravenous Q12H  . ipratropium-albuterol  3 mL Nebulization Q6H  . levothyroxine  75 mcg Oral QAC breakfast  . loratadine  10 mg Oral Daily  . methylPREDNISolone (SOLU-MEDROL) injection  60 mg Intravenous Q12H  . multivitamin with minerals  1 tablet Oral Daily  . pantoprazole  40 mg Oral Daily  . sodium chloride flush  3 mL Intravenous Q12H    have  reviewed scheduled and prn medications.  Physical Exam: General: bipap , wead, c/o headache Heart: RRR Lungs: not examined Abdomen: soft Extremities: some pitting edema    05/23/2016,11:26 AM  LOS: 2 days

## 2016-05-24 LAB — BASIC METABOLIC PANEL
ANION GAP: 13 (ref 5–15)
BUN: 20 mg/dL (ref 6–20)
CHLORIDE: 76 mmol/L — AB (ref 101–111)
CO2: 27 mmol/L (ref 22–32)
Calcium: 8.9 mg/dL (ref 8.9–10.3)
Creatinine, Ser: 1 mg/dL (ref 0.44–1.00)
GFR calc Af Amer: >60 mL/min (ref >60)
GFR calc non Af Amer: 53 mL/min — ABNORMAL LOW (ref >60)
GLUCOSE: 121 mg/dL — AB (ref 65–99)
POTASSIUM: 4.1 mmol/L (ref 3.5–5.1)
Sodium: 116 mmol/L — CL (ref 135–145)

## 2016-05-24 LAB — CBC
HEMATOCRIT: 28.4 % — AB (ref 36.0–46.0)
HEMOGLOBIN: 9.6 g/dL — AB (ref 12.0–15.0)
MCH: 29.4 pg (ref 26.0–34.0)
MCHC: 33.8 g/dL (ref 30.0–36.0)
MCV: 87.1 fL (ref 78.0–100.0)
Platelets: 247 10*3/uL (ref 150–400)
RBC: 3.26 MIL/uL — ABNORMAL LOW (ref 3.87–5.11)
RDW: 15.5 % (ref 11.5–15.5)
WBC: 9.1 10*3/uL (ref 4.0–10.5)

## 2016-05-24 MED ORDER — GLYCOPYRROLATE 0.2 MG/ML IJ SOLN
0.1000 mg | INTRAMUSCULAR | Status: DC | PRN
  Administered 2016-05-24: 0.1 mg via INTRAVENOUS
  Filled 2016-05-24 (×2): qty 0.5

## 2016-05-24 DEATH — deceased

## 2016-06-24 NOTE — Progress Notes (Signed)
[facility] notified of patient's expiration.  Initial Case# [id number] after speaking with Timmothy Sours with [facility], patient was a potential eye donor.  Received telephone call from April Stone and patient not canidate for eye donor due to age.  New Case#[id number].  Sanda Linger

## 2016-06-24 NOTE — Progress Notes (Signed)
Pt off bipap, with 02 BNC , suction set up at bedside, resp 4 - 6 per minute, resting comfortably at present, family at bedside will continue to monitor closely.  Edward Qualia RN

## 2016-06-24 NOTE — Care Management Important Message (Signed)
Important Message  Patient Details  Name: Beth [name] MRN: [id number] Date of Birth: December 27, 1935   Medicare Important Message Given:  Yes    [name] May 2016/06/05, 12:32 PM

## 2016-06-24 NOTE — Progress Notes (Signed)
Called to pts room by family member at 1530, pt not breathing, 0 pulse, 0 audible heart beat, death verified by Thurston Hole RN and Edward Qualia RN, all iv tx d/cd, family in room at present, donor services contacted by charge nurse Hubert Azure RN.  Edward Qualia RN

## 2016-06-24 NOTE — Progress Notes (Signed)
PROGRESS NOTE    Beth [name]  [id number] DOB: 1935/12/05 DOA: 05/13/2016 PCP: Imagene Riches, MD   Chief Complaint  Patient presents with  . Shortness of Breath    Brief Narrative:  HPI on 05/01/2016 by Dr. Ivor Costa Beth [name] is a 81 y.o. female with medical history significant of dCHF, mitral regurgitation, ascending and transverse thoracic aortic aneurysm (s/p of endograft repair in February 2006 at [facility]), descending thoracic aortic aneurysm (s/p of percutaneous repair/endograft 2010 in [facility]),  HTN, GERD, hypothyroidism, breast cancer, dementia, atrial fibrillation not on anticoagulants, CKD-III, former smoker (quit 40 years ago), who presents with SOB. Patient states that she started having shortness of breath yesterday morning, which has been progressively getting worse. She has mild dry cough. She does not have chest pain, fever or chills. She could not speak in full sentences on arrival to ED. No fever or chills. No runny nose or sore throat. Patient denies nausea, vomiting, diarrhea, abdominal pain, symptoms of UTI or unilateral weakness.   Assessment & Plan   Acute respiratory failure with hypoxia secondary to tracheal compression vs Acute on chronic combined CHF exacerbation vs COPD exac -Upon admission, BNP 557 -CXR unremarkable  -Echoacardiogram July 2017 (at [facility]) showed EF 45-50% -cardiology consulted and appreciated -Currently fluid restriction -Patient did have "stridorous" breath sounds earlier today, given dose of racemic epi. PCCM consulted and appreciated. Not sure if these sounds are VCD vs other etiology  -Continue BiPAP -patient given IV lasix yesterday, UOP 2225cc over the past 24hrs -CT neck: Increase in size of previously treated thoracic aortic aneurysm causing mass effect on the distal carina and proximal right and left mainstem bronchi -CT surgery consulted.  I also spoke with Dr. Lunette Stands (CT surg at [facility]) on 11/29.  Patient is a  poor surgical candidate. This was also confirmed by Dr. Servando Snare.  -Spoke with patient's son via phone, decision for DNR and comfort made on 11/29.  -Discontinued steroids and doxycycline -Patient depended on BiPAP -Discussed with patient's son, BiPAP removed. Patient placed on morphine drip for comfort (can be titrated) and ativan.  Hyponatremia -Sodium today 116 -Failed fluid restriction and high dose lasix -spoke with nephrology, Dr. Jimmy Footman, recommended fluid restriction and lasix (pending formal onsult) -TSH 0.377 -Given comfort care and poor prognosis, will hold off of tolvaptan.   Atrial Fibrillation  -CHA2DS2-VASc Score 6 -Not on anticoagulation due to endoleak -Flecainide discontinued -Continue labetalol and aspirin  Hypothyroidism -Continue synthroid -TSH 0.377  GERD -Continue PPI  Essential hypertension -Hold Cozaar and lasix due to severe hyponatremia -BP currently stable  Hyperkalemia  -was K=5.6 without T wave peaking on EKG -Currently 4.1  CKD (chronic kidney disease), stage III  -Stable. Baseline creatinine by report 1-1.3, currently Cr 1   Normocytic anemia -Baseline hemoglobin 11, currently 9.6 -Anemia panel: Iron 24  Dementia  Goals of care -Code status discussed with son via phone. Currently DNR -Patient does have a poor prognosis  -Currently on morphine and ativan PRN for comfort.   -BiPAP discontinued today.  DVT Prophylaxis  Lovenox discontinued due to comfort care  Code Status: DNR  Family Communication: Family at bedside  Disposition Plan: Admitted. Likely hospital death  Consultants Cardiology Nephrology Pulmonology  CT surgery ENT  Procedures  None  Antibiotics   Anti-infectives    Start     Dose/Rate Route Frequency Ordered Stop   05/02/2016 1300  doxycycline (VIBRA-TABS) tablet 100 mg  Status:  Discontinued     100  mg Oral Every 12 hours 04/26/2016 1245 05/23/16 1349      Subjective:   Beth [name] seen and  examined today.  Resting comfortably.  Objective:   Vitals:   05/23/16 1420 05/23/16 1523 05/23/16 2202 05/23/16 2204  BP: 128/89     Pulse: 71 73 75   Resp: (!) 22 (!) 26 (!) 24   Temp: 98.6 F (37 C)     TempSrc: Oral     SpO2: 100% 100% 100% 100%  Weight:      Height:        Intake/Output Summary (Last 24 hours) at 06/09/16 1220 Last data filed at 06-09-16 1007  Gross per 24 hour  Intake            374.1 ml  Output              650 ml  Net           -275.9 ml   Filed Weights   05/04/2016 0149 05/22/16 0445 05/23/16 0438  Weight: 64.4 kg (142 lb) 71.8 kg (158 lb 3.2 oz) 71.7 kg (158 lb)    Exam  General: Well developed, well nourished, respiratory distress  HEENT: NCAT, BiPAP in place  Cardiovascular: S1 S2 auscultated, irregular   Respiratory: Diminished  Abdomen: Soft, nontender, nondistended, + bowel sounds  Extremities: warm dry without cyanosis clubbing.   Data Reviewed: I have personally reviewed following labs and imaging studies  CBC:  Recent Labs Lab 04/28/2016 0210 05/15/2016 0425 05/22/16 0051 05/23/16 0311 06/09/16 0252  WBC 6.5  --  7.6 7.4 9.1  NEUTROABS 5.6  --   --   --   --   HGB 9.3* 10.5* 9.9* 8.6* 9.6*  HCT 27.4* 31.0* 29.8* 25.5* 28.4*  MCV 87.3  --  88.7 86.4 87.1  PLT 199  --  216 223 [id number]   Basic Metabolic Panel:  Recent Labs Lab 05/09/2016 0210 04/27/2016 0425 05/22/16 0051 05/23/16 0311 06/09/16 0252  [name] 119* 118* 120* 112* 116*  K 5.6* 5.2* 5.1 4.4 4.1  CL 85* 85* 84* 78* 76*  CO2 23  --  27 25 27   GLUCOSE 150* 123* 162* 141* 121*  BUN 17 19 14 20 20   CREATININE 1.15* 1.00 0.98 0.91 1.00  CALCIUM 9.0  --  9.0 8.5* 8.9   GFR: Estimated Creatinine Clearance: 45 mL/min (by C-G formula based on SCr of 1 mg/dL). Liver Function Tests: No results for input(s): AST, ALT, ALKPHOS, BILITOT, PROT, ALBUMIN in the last 168 hours. No results for input(s): LIPASE, AMYLASE in the last 168 hours. No results for input(s): AMMONIA in  the last 168 hours. Coagulation Profile: No results for input(s): INR, PROTIME in the last 168 hours. Cardiac Enzymes:  Recent Labs Lab 05/07/2016 0634 05/18/2016 1121 04/25/2016 1835  TROPONINI <0.03 <0.03 <0.03   BNP (last 3 results) No results for input(s): PROBNP in the last 8760 hours. HbA1C: No results for input(s): HGBA1C in the last 72 hours. CBG: No results for input(s): GLUCAP in the last 168 hours. Lipid Profile: No results for input(s): CHOL, HDL, LDLCALC, TRIG, CHOLHDL, LDLDIRECT in the last 72 hours. Thyroid Function Tests:  Recent Labs  05/22/16 1237  TSH 0.323*   Anemia Panel:  Recent Labs  05/22/16 0051  VITAMINB12 1,327*  FOLATE 44.7  FERRITIN 185  TIBC 339  IRON 24*  RETICCTPCT 3.5*   Urine analysis:    Component Value Date/Time   COLORURINE AMBER (A) 05/22/2016 1521  APPEARANCEUR CLEAR 05/22/2016 1521   LABSPEC 1.019 05/22/2016 1521   PHURINE 5.0 05/22/2016 1521   GLUCOSEU NEGATIVE 05/22/2016 1521   HGBUR NEGATIVE 05/22/2016 1521   BILIRUBINUR NEGATIVE 05/22/2016 1521   KETONESUR NEGATIVE 05/22/2016 1521   PROTEINUR 100 (A) 05/22/2016 1521   UROBILINOGEN 0.2 12/09/2013 0802   NITRITE NEGATIVE 05/22/2016 1521   LEUKOCYTESUR NEGATIVE 05/22/2016 1521   Sepsis Labs: @LABRCNTIP (procalcitonin:4,lacticidven:4)  ) Recent Results (from the past 240 hour(s))  MRSA PCR Screening     Status: None   Collection Time: 05/22/16  1:44 AM  Result Value Ref Range Status   MRSA by PCR NEGATIVE NEGATIVE Final    Comment:        The GeneXpert MRSA Assay (FDA approved for NASAL specimens only), is one component of a comprehensive MRSA colonization surveillance program. It is not intended to diagnose MRSA infection nor to guide or monitor treatment for MRSA infections.       Radiology Studies: Ct Soft Tissue Neck Wo Contrast  Result Date: 05/22/2016 CLINICAL DATA:  Stridor. Wheezing and stridor when night using BiPAP. EXAM: CT NECK WITHOUT  CONTRAST TECHNIQUE: Multidetector CT imaging of the neck was performed following the standard protocol without intravenous contrast. COMPARISON:  CT of the face 06/29/2015 FINDINGS: Pharynx and larynx: No focal mucosal or submucosal lesions are present. The airway is patent. Vocal cords are midline and symmetric. No focal mucosal or submucosal lesions are present. The parapharyngeal fat is clear. Salivary glands: Submandibular and parotid glands are within normal limits bilaterally. Thyroid: Within normal limits. Lymph nodes: No significant adenopathy is present. Vascular: There is significant interval enlargement of the previously noted aortic aneurysm. The aneurysm now measures 6.4 cm. There is expansion of the aneurysm sac across the midline which results in mass effect on the distal trachea, carina, and right and left mainstem bronchi. High-density material within the expanded aneurysm may represent blood products. Limited intracranial: Within normal limits. Visualized orbits: Unremarkable. Mastoids and visualized paranasal sinuses: Clear Skeleton: Degenerative changes of the cervical spine are most pronounced at C5-6, stable compared to prior exams. Upper chest: The lung apices are clear. IMPRESSION: 1. Significant increase in size of previously treated thoracic aortic aneurysm suggesting further endoleak or contained rupture. The aneurysm no expands cross midline and results in significant mass effect on the distal carina and proximal right and left mainstem bronchi. The distal aspect of the aneurysm is not imaged. 2. The airway in the neck is unremarkable. These results were called by telephone at the time of interpretation on 05/22/2016 at 4:53 pm to Dr. Ree Kida, who verbally acknowledged these results. Electronically Signed   By: San Morelle M.D.   On: 05/22/2016 16:53     Scheduled Meds: . aspirin EC  81 mg Oral QODAY  . budesonide (PULMICORT) nebulizer solution  0.25 mg Nebulization BID  .  ipratropium-albuterol  3 mL Nebulization Q6H  . levothyroxine  75 mcg Oral QAC breakfast  . multivitamin with minerals  1 tablet Oral Daily  . pantoprazole  40 mg Oral Daily  . sodium chloride flush  3 mL Intravenous Q12H   Continuous Infusions: . morphine 4 mg/hr (06-03-16 1029)  . nitroGLYCERIN 15 mcg/min (05/23/16 1230)     LOS: 3 days   Time Spent in minutes   30 minutes  Luby Seamans D.O. on 03-Jun-2016 at 12:20 PM  Between 7am to 7pm - Pager - [phone]  After 7pm go to [email or web address] - password [email or web address]  And look for the night  coverage person covering for me after hours  [facility] Office  [phone]  

## 2016-06-24 NOTE — Discharge Summary (Signed)
Death Summary  Beth [name] [id number] DOB: 1935-11-09 DOA: 19-Jun-2016  PCP: Imagene Riches, MD  Admit date: 2016-06-19 Date of Death: 2016/06/22 Time of Death: 10/20/33  Notification: Imagene Riches, MD notified of death of 06-22-16   History of present illness:  on 2016-06-19 by Dr. Wynne Dust A Harrisis a 81 y.o.femalewith medical history significant of dCHF, mitral regurgitation, ascending and transverse thoracic aortic aneurysm (s/p of endograft repairin February 2006 at [facility]), descending thoracic aortic aneurysm (s/p of percutaneous repair/endograft 2008/10/20 in [facility]), HTN, GERD, hypothyroidism, breast cancer, dementia, atrial fibrillation not on anticoagulants, CKD-III, former smoker (quit 40 years ago), who presents with SOB. Patient states that she started having shortness of breath yesterday morning, which has been progressively getting worse. She has mild dry cough.She does not have chest pain, fever or chills. She could not speak in full sentences on arrival to ED. No fever or chills. No runny nose or sore throat. Patient denies nausea, vomiting, diarrhea, abdominal pain, symptoms of UTI or unilateral weakness  Final Diagnoses:  Acute respiratory failure with hypoxia secondary to tracheal compression vs Acute on chronic combined CHF exacerbation vs COPD exac -Upon admission, BNP 557 -CXR unremarkable  -Echoacardiogram July 2017 (at [facility]) showed EF 45-50% -cardiology consulted and appreciated -Currently fluid restriction -Patient did have "stridorous" breath sounds earlier today, given dose of racemic epi. PCCM consulted and appreciated. Not sure if these sounds are VCD vs other etiology  -Continue BiPAP -patient given IV lasix yesterday, UOP 2225cc over the past 24hrs -CT neck: Increase in size of previously treated thoracic aortic aneurysm causing mass effect on the distal carina and proximal right and left mainstem bronchi -CT surgery consulted.  I  also spoke with Dr. Lunette Stands (CT surg at [facility]) on 11/29.  Patient is a poor surgical candidate. This was also confirmed by Dr. Servando Snare.  -Spoke with patient's son via phone, decision for DNR and comfort made on 11/29.  -Discontinued steroids and doxycycline -Patient was dependent on BiPAP -Discussed with patient's son, BiPAP removed. Patient placed on morphine drip for comfort (can be titrated) and ativan. Expired at 1535  Hyponatremia -Sodium today 116 -Failed fluid restriction and high dose lasix -spoke with nephrology, Dr. Jimmy Footman, recommended fluid restriction and lasix (pending formal onsult) -TSH 0.377 -Given comfort care and poor prognosis, will hold off of tolvaptan.   Atrial Fibrillation  -CHA2DS2-VASc Score 6 -Not on anticoagulation due to endoleak -Flecainide discontinued  Hypothyroidism -TSH 0.377  GERD  Essential hypertension -Held Cozaar and lasixdue to severe hyponatremia  Hyperkalemia  -was K=5.6 without T wave peaking on EKG -Currently 4.1  CKD (chronic kidney disease), stage III -Stable. Baseline creatinine by report 1-1.3, currently Cr 1  Normocytic anemia -Baseline hemoglobin 11, currently 9.6 -Anemia panel: Iron 24  Dementia  Goals of care -Code status discussed with son via phone. Currently DNR -Patient does have a poor prognosis  -Was placed on morphine and ativan PRN for comfort.   -BiPAP discontinued today. -Patient passed with family in the room at 1535.   The results of significant diagnostics from this hospitalization (including imaging, microbiology, ancillary and laboratory) are listed below for reference.    Significant Diagnostic Studies: Ct Soft Tissue Neck Wo Contrast  Result Date: 05/22/2016 CLINICAL DATA:  Stridor. Wheezing and stridor when night using BiPAP. EXAM: CT NECK WITHOUT CONTRAST TECHNIQUE: Multidetector CT imaging of the neck was performed following the standard protocol without intravenous contrast.  COMPARISON:  CT of the face 06/29/2015 FINDINGS: Pharynx  and larynx: No focal mucosal or submucosal lesions are present. The airway is patent. Vocal cords are midline and symmetric. No focal mucosal or submucosal lesions are present. The parapharyngeal fat is clear. Salivary glands: Submandibular and parotid glands are within normal limits bilaterally. Thyroid: Within normal limits. Lymph nodes: No significant adenopathy is present. Vascular: There is significant interval enlargement of the previously noted aortic aneurysm. The aneurysm now measures 6.4 cm. There is expansion of the aneurysm sac across the midline which results in mass effect on the distal trachea, carina, and right and left mainstem bronchi. High-density material within the expanded aneurysm may represent blood products. Limited intracranial: Within normal limits. Visualized orbits: Unremarkable. Mastoids and visualized paranasal sinuses: Clear Skeleton: Degenerative changes of the cervical spine are most pronounced at C5-6, stable compared to prior exams. Upper chest: The lung apices are clear. IMPRESSION: 1. Significant increase in size of previously treated thoracic aortic aneurysm suggesting further endoleak or contained rupture. The aneurysm no expands cross midline and results in significant mass effect on the distal carina and proximal right and left mainstem bronchi. The distal aspect of the aneurysm is not imaged. 2. The airway in the neck is unremarkable. These results were called by telephone at the time of interpretation on 05/22/2016 at 4:53 pm to Dr. Ree Kida, who verbally acknowledged these results. Electronically Signed   By: San Morelle M.D.   On: 05/22/2016 16:53   Dg Chest Port 1 View  Result Date: 05/22/2016 CLINICAL DATA:  COPD, shortness of breath, hypertension, CHF, atrial fibrillation EXAM: PORTABLE CHEST 1 VIEW COMPARISON:  Portable exam 0645 hours compared to 05/15/2016 FINDINGS: Enlargement of cardiac  silhouette post median sternotomy. Aneurysmal and tortuous thoracic aorta post endoluminal stenting. Pulmonary vascularity normal. Peribronchial thickening with minimal bibasilar atelectasis. No gross infiltrate, pleural effusion or pneumothorax. Bones demineralized. IMPRESSION: Enlargement of cardiac silhouette with evidence of prior aortic endoluminal stenting for aneurysm. Bronchitic changes with minimal bibasilar atelectasis. Electronically Signed   By: Lavonia Dana M.D.   On: 05/22/2016 07:58   Dg Chest Portable 1 View  Result Date: 05/01/2016 CLINICAL DATA:  Difficulty breathing. EXAM: PORTABLE CHEST 1 VIEW COMPARISON:  Chest radiograph 04/16/2016 FINDINGS: Median sternotomy wires and thoracic aortic endograft stent are unchanged. Cardiac silhouette with upper mediastinal widening is also unchanged. Lung aeration is decreased compared to the prior study, particularly at the left lung base. No pneumothorax or pleural effusion. IMPRESSION: Decreased aeration of both lungs, most notably at the lung bases, likely atelectasis. Electronically Signed   By: Ulyses Jarred M.D.   On: 05/20/2016 02:32    Microbiology: Recent Results (from the past 240 hour(s))  MRSA PCR Screening     Status: None   Collection Time: 05/22/16  1:44 AM  Result Value Ref Range Status   MRSA by PCR NEGATIVE NEGATIVE Final    Comment:        The GeneXpert MRSA Assay (FDA approved for NASAL specimens only), is one component of a comprehensive MRSA colonization surveillance program. It is not intended to diagnose MRSA infection nor to guide or monitor treatment for MRSA infections.      Labs: Basic Metabolic Panel:  Recent Labs Lab 05/16/2016 0210 05/20/2016 0425 05/22/16 0051 05/23/16 0311 2016-06-16 0252  NA 119* 118* 120* 112* 116*  K 5.6* 5.2* 5.1 4.4 4.1  CL 85* 85* 84* 78* 76*  CO2 23  --  27 25 27   GLUCOSE 150* 123* 162* 141* 121*  BUN 17 19 14 20  20  CREATININE 1.15* 1.00 0.98 0.91 1.00  CALCIUM 9.0   --  9.0 8.5* 8.9   Liver Function Tests: No results for input(s): AST, ALT, ALKPHOS, BILITOT, PROT, ALBUMIN in the last 168 hours. No results for input(s): LIPASE, AMYLASE in the last 168 hours. No results for input(s): AMMONIA in the last 168 hours. CBC:  Recent Labs Lab 04/26/2016 0210 05/04/2016 0425 05/22/16 0051 05/23/16 0311 06/06/16 0252  WBC 6.5  --  7.6 7.4 9.1  NEUTROABS 5.6  --   --   --   --   HGB 9.3* 10.5* 9.9* 8.6* 9.6*  HCT 27.4* 31.0* 29.8* 25.5* 28.4*  MCV 87.3  --  88.7 86.4 87.1  PLT 199  --  216 223 247   Cardiac Enzymes:  Recent Labs Lab 04/25/2016 0634 05/01/2016 1121 05/09/2016 1835  TROPONINI <0.03 <0.03 <0.03   D-Dimer No results for input(s): DDIMER in the last 72 hours. BNP: Invalid input(s): POCBNP CBG: No results for input(s): GLUCAP in the last 168 hours. Anemia work up  Recent Labs  05/22/16 0051  VITAMINB12 1,327*  FOLATE 44.7  FERRITIN 185  TIBC 339  IRON 24*  RETICCTPCT 3.5*   Urinalysis    Component Value Date/Time   COLORURINE AMBER (A) 05/22/2016 1521   APPEARANCEUR CLEAR 05/22/2016 1521   LABSPEC 1.019 05/22/2016 1521   PHURINE 5.0 05/22/2016 1521   GLUCOSEU NEGATIVE 05/22/2016 1521   HGBUR NEGATIVE 05/22/2016 1521   BILIRUBINUR NEGATIVE 05/22/2016 1521   KETONESUR NEGATIVE 05/22/2016 1521   PROTEINUR 100 (A) 05/22/2016 1521   UROBILINOGEN 0.2 12/09/2013 0802   NITRITE NEGATIVE 05/22/2016 1521   LEUKOCYTESUR NEGATIVE 05/22/2016 1521   Sepsis Labs Invalid input(s): PROCALCITONIN,  WBC,  LACTICIDVEN     SIGNED:  Cristal Ford, MD  [facility] 06-06-2016, 3:59 PM Pager   If 7PM-7AM, please contact night-coverage [email or web address] Password [email or web address]

## 2016-06-24 DEATH — deceased

## 2017-10-20 IMAGING — CT CT NECK W/O CM
4 of 5 series · 15 of 33 positions shown, 17 images · non-contrast
Comparison: CT of the face 06/29/2015

CLINICAL DATA: Stridor. Wheezing and stridor when night using
BiPAP.

EXAM:
CT NECK WITHOUT CONTRAST
TECHNIQUE: Multidetector CT imaging of the neck was performed following the
standard protocol without intravenous contrast.

[Series 201: soft tissue, idose (2) · axial · 0.50mm/px · z∈[+106,+238]mm · 3 of 132 slices shown]
[im 33/132  soft-tissue]
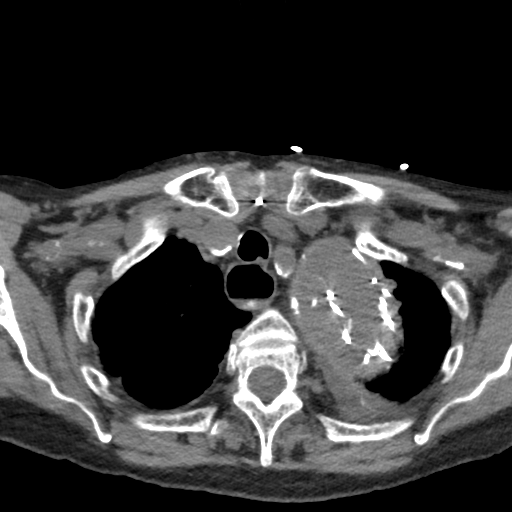
[im 66/132  soft-tissue]
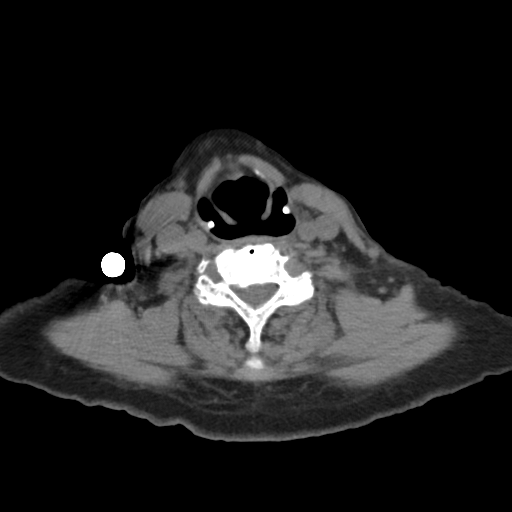
[im 99/132  soft-tissue]
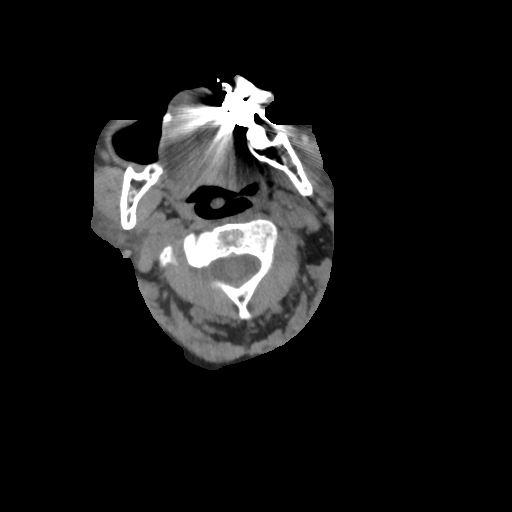

[Series 203: coronal, idose (2) · coronal · 0.48mm/px · 3 of 102 slices shown]
[im 30/102  bone]
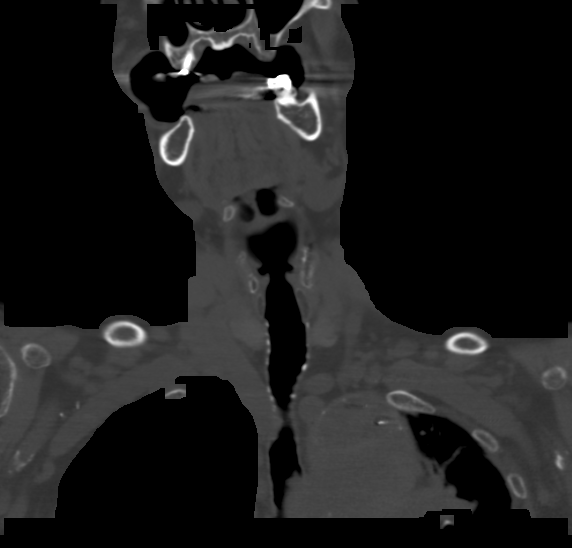
[im 44/102  bone]
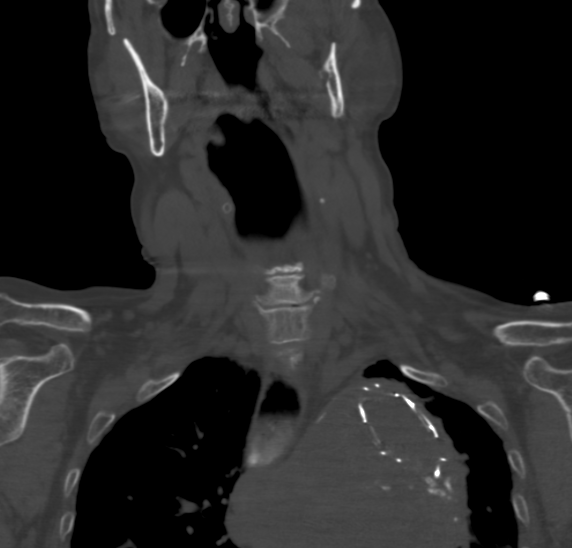
[im 58/102  bone]
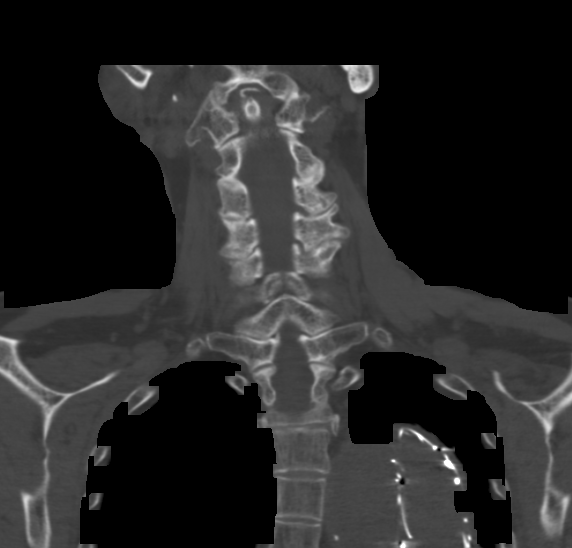

[Series 204: sagittal, idose (2) · sagittal · 0.45mm/px · 5 of 137 slices shown, 6 images]
[im 46/137  bone]
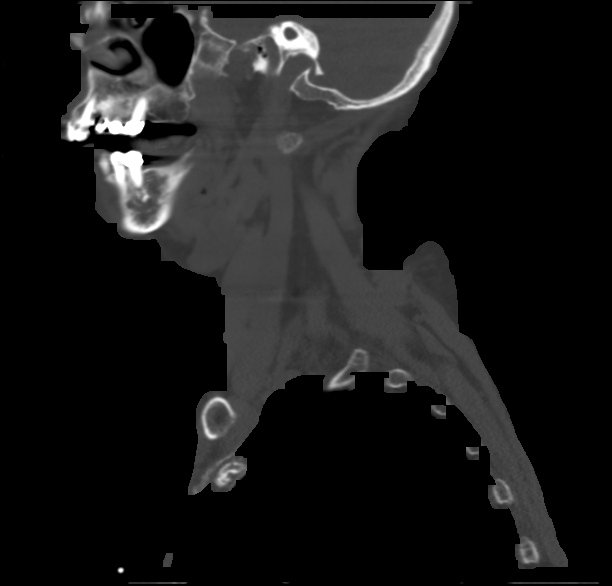
[im 57/137  bone]
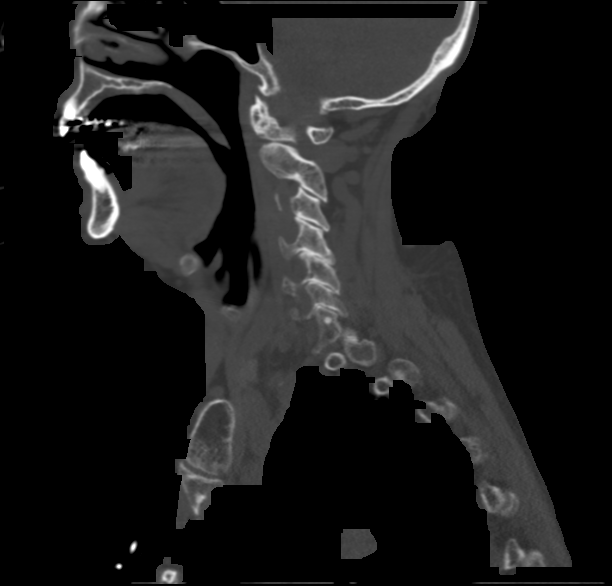
[im 69/137  soft-tissue]
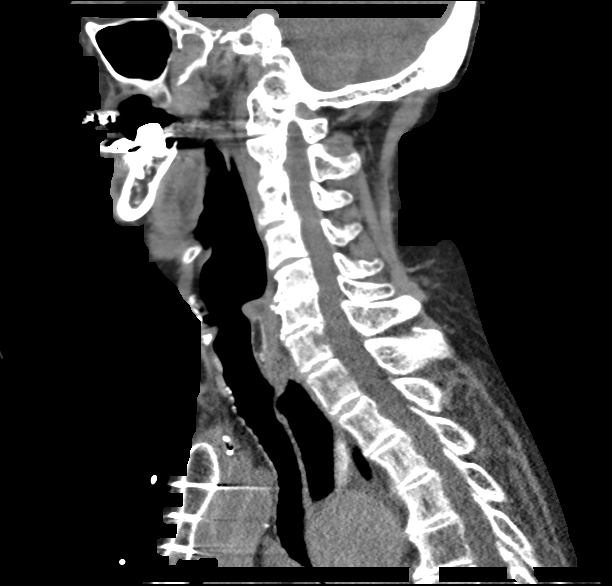
[im 69/137  bone]
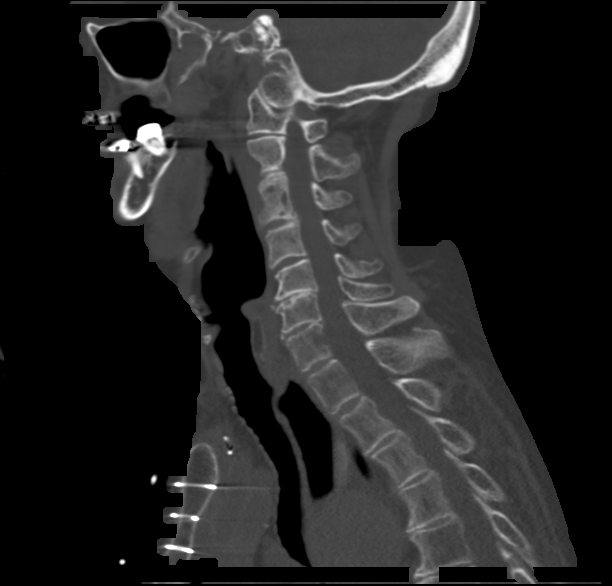
[im 80/137  bone]
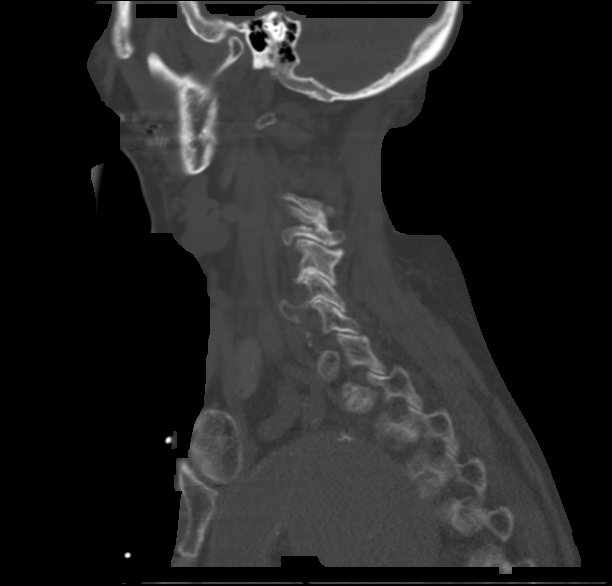
[im 91/137  bone]
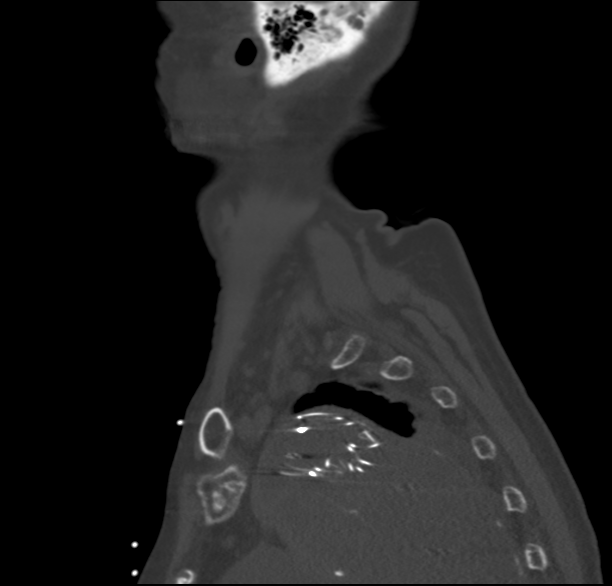

[Series 205: orthogonal, idose (2) · axial · 0.56mm/px · z∈[+73,+226]mm · 4 of 132 slices shown, 5 images]
[im 27/132  soft-tissue]
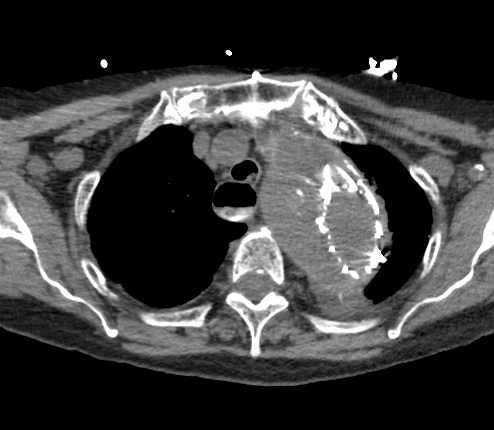
[im 27/132  bone]
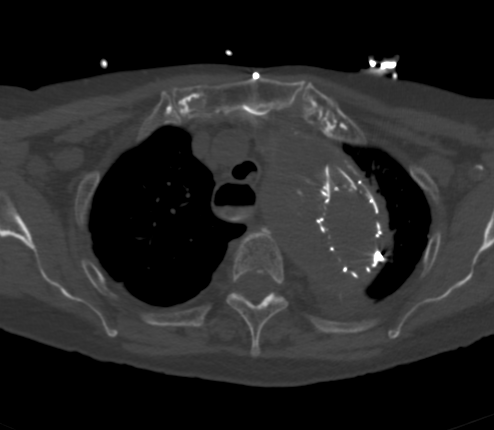
[im 53/132  bone]
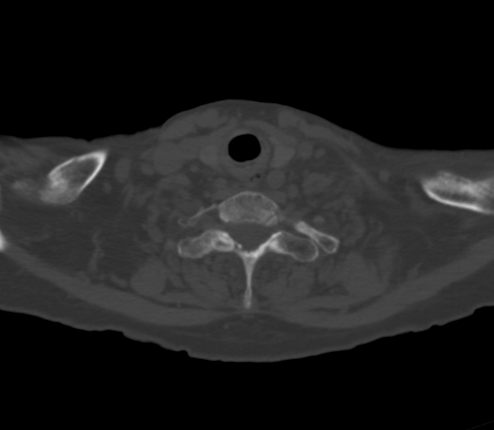
[im 79/132  bone]
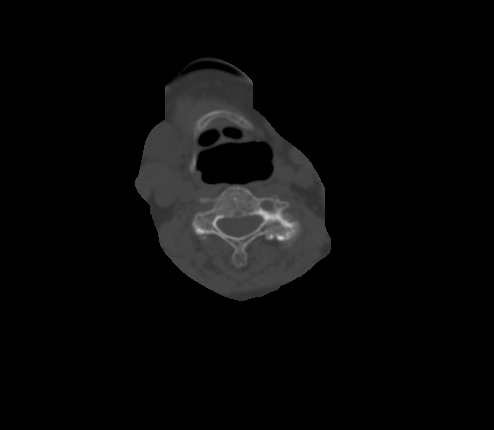
[im 105/132  bone]
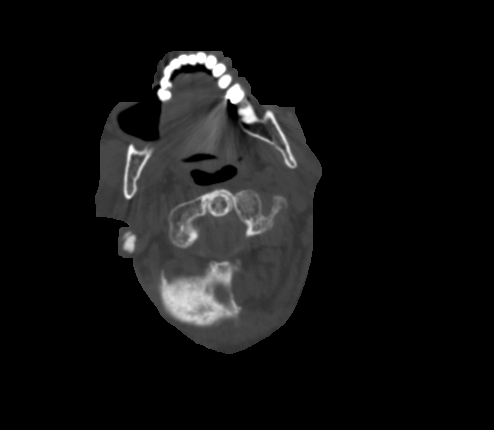

[15 of 33 positions shown; findings below may reference images not displayed]

FINDINGS: Pharynx and larynx: No focal mucosal or submucosal lesions are
present. The airway is patent. Vocal cords are midline and
symmetric. No focal mucosal or submucosal lesions are present. The
parapharyngeal fat is clear.

Salivary glands: Submandibular and parotid glands are within normal
limits bilaterally.

Thyroid: Within normal limits.

Lymph nodes: No significant adenopathy is present.

Vascular: There is significant interval enlargement of the
previously noted aortic aneurysm. The aneurysm now measures 6.4 cm.
There is expansion of the aneurysm sac across the midline which
results in mass effect on the distal trachea, carina, and right and
left mainstem bronchi. High-density material within the expanded
aneurysm may represent blood products.

Limited intracranial: Within normal limits.

Visualized orbits: Unremarkable.

Mastoids and visualized paranasal sinuses: Clear

Skeleton: Degenerative changes of the cervical spine are most
pronounced at C5-6, stable compared to prior exams.

Upper chest: The lung apices are clear.
IMPRESSION: 1. Significant increase in size of previously treated thoracic
aortic aneurysm suggesting further endoleak or contained rupture.
The aneurysm no expands cross midline and results in significant
mass effect on the distal carina and proximal right and left
mainstem bronchi. The distal aspect of the aneurysm is not imaged.
2. The airway in the neck is unremarkable.
These results were called by telephone at the time of interpretation
on 05/22/2016 at [DATE] to Dr. ZEIDA, who verbally acknowledged
these results.
